# Patient Record
Sex: Female | Born: 1990 | Race: White | Hispanic: No | Marital: Married | Smoking: Never smoker
Health system: Southern US, Community
[De-identification: ages and names within clinical notes are randomized; demographics above are authoritative.]

## PROBLEM LIST (undated history)

## (undated) DIAGNOSIS — I209 Angina pectoris, unspecified: Secondary | ICD-10-CM

---

## 2020-04-04 ENCOUNTER — Telehealth: Payer: BC Managed Care – PPO | Admitting: Nurse Practitioner

## 2020-04-04 ENCOUNTER — Encounter: Payer: Self-pay | Admitting: Nurse Practitioner

## 2020-04-04 DIAGNOSIS — R059 Cough, unspecified: Secondary | ICD-10-CM

## 2020-04-04 MED ORDER — PREDNISONE 20 MG PO TABS: 40.0000 mg | ORAL_TABLET | Freq: Every day | ORAL | 0 refills | Status: AC

## 2020-04-04 MED ORDER — AZITHROMYCIN 250 MG PO TABS: ORAL_TABLET | ORAL | 0 refills | Status: DC

## 2020-04-04 MED ORDER — PROMETHAZINE-DM 6.25-15 MG/5ML PO SYRP: 5.0000 mL | ORAL_SOLUTION | Freq: Four times a day (QID) | ORAL | 0 refills | Status: AC | PRN

## 2020-04-04 NOTE — Progress Notes (Signed)
Terri Bradshaw are scheduled for a virtual visit with your provider today.    Just as we do with appointments in the office, we must obtain your consent to participate.  Your consent will be active for this visit and any virtual visit you may have with one of our providers in the next 365 days.    If you have a MyChart account, I can also send a copy of this consent to you electronically.  All virtual visits are billed to your insurance company just like a traditional visit in the office.  As this is a virtual visit, video technology does not allow for your provider to perform a traditional examination.  This may limit your provider's ability to fully assess your condition.  If your provider identifies any concerns that need to be evaluated in person or the need to arrange testing such as labs, EKG, etc, we will make arrangements to do so.    Although advances in technology are sophisticated, we cannot ensure that it will always work on either your end or our end.  If the connection with a video visit is poor, we may have to switch to a telephone visit.  With either a video or telephone visit, we are not always able to ensure that we have a secure connection.   I need to obtain your verbal consent now.   Are you willing to proceed with your visit today?   Rodneisha Bonnet has provided verbal consent on 04/04/2020 for a virtual visit (video or telephone).   Mary-Margaret Daphine Deutscher, FNP 04/04/2020  1:34 PM   Virtual Visit via video Note   Due to COVID-19 pandemic this visit was conducted virtually. This visit type was conducted due to national recommendations for restrictions regarding the COVID-19 Pandemic (e.g. social distancing, sheltering in place) in an effort to limit this patient's exposure and mitigate transmission in our community. All issues noted in this document were discussed and addressed.  A physical exam was not performed with this format.  I connected with  Terri Bradshaw  on  04/04/20 at 1:32 by telephone, video was not available on patients end. and verified that I am speaking with the correct person using two identifiers. Terri Bradshaw is currently located at home and no one is currently with her during visit. The provider, Mary-Margaret Daphine Deutscher, FNP is located in their office at time of visit.  I discussed the limitations, risks, security and privacy concerns of performing an evaluation and management service by video  and the availability of in person appointments. I also discussed with the patient that there may be a patient responsible charge related to this service. The patient expressed understanding and agreed to proceed.   History and Present Illness:   Chief Complaint: Cough   HPI Patients states that cough started 2 weeks ago. [facility]essalon perles which have not helped. The cough feels like something is going to come up but does not . She says during the day cough is dry.she has tried no other OTC meds.   Review of Systems  Constitutional: Positive for malaise/fatigue. Negative for chills and fever.  HENT: Positive for congestion. Negative for sinus pain and sore throat.   Respiratory: Positive for cough. Negative for sputum production and shortness of breath.   Musculoskeletal: Negative for myalgias.  Neurological: Negative for dizziness and headaches.       Observations/Objective: Alert and oriented- answers all questions appropriately No distress  dry cough during visit no SOB noted   Assessment and Plan: Terri Bradshaw in today with chief complaint of Cough   1. Cough 1. Take meds as prescribed 2. Use a cool mist humidifier especially during the winter months and when heat has been humid. 3. Use saline nose sprays frequently 4. Saline irrigations of the nose can be very helpful if done frequently.  * 4X daily for 1 week*  * Use of a nettie pot can be helpful with this. Follow  directions with this* 5. Drink plenty of fluids 6. Keep thermostat turn down low 7.For any cough or congestion  Cough meds as prescribed 8. For fever or aces or pains- take tylenol or ibuprofen appropriate for age and weight.  * for fevers greater than 101 orally you may alternate ibuprofen and tylenol every  3 hours.   Meds ordered this encounter  Medications  . predniSONE (DELTASONE) 20 MG tablet    Sig: Take 2 tablets (40 mg total) by mouth daily with breakfast for 5 days. 2 po daily for 5 days    Dispense:  10 tablet    Refill:  0    Order Specific Question:   Supervising Provider    Answer:   Hyacinth Meeker, BRIAN [[id number]]  . promethazine-dextromethorphan (PROMETHAZINE-DM) 6.25-15 MG/5ML syrup    Sig: Take 5 mLs by mouth 4 (four) times daily as needed for cough.    Dispense:  118 mL    Refill:  0    Order Specific Question:   Supervising Provider    Answer:   MILLER, BRIAN [[id number]]  . azithromycin (ZITHROMAX Z-PAK) 250 MG tablet    Sig: As directed    Dispense:  6 tablet    Refill:  0    Order Specific Question:   Supervising Provider    Answer:   Eber Hong [[id number]]        Follow Up Instructions: prn    I discussed the assessment and treatment plan with the patient. The patient was provided an opportunity to ask questions and all were answered. The patient agreed with the plan and demonstrated an understanding of the instructions.   The patient was advised to call back or seek an in-person evaluation if the symptoms worsen or if the condition fails to improve as anticipated.  The above assessment and management plan was discussed with the patient. The patient verbalized understanding of and has agreed to the management plan. Patient is aware to call the clinic if symptoms persist or worsen. Patient is aware when to return to the clinic for a follow-up visit. Patient educated on when it is appropriate to go to the emergency department.   Time call ended:1:45 I provided 13  minutes of face-to-face time during this encounter.    Mary-Margaret Daphine Deutscher, FNP

## 2020-10-12 ENCOUNTER — Other Ambulatory Visit: Payer: Self-pay

## 2020-10-12 ENCOUNTER — Emergency Department: Admission: EM | Admit: 2020-10-12 | Discharge: 2020-10-12 | Discharge status: Absent without leave | Payer: Self-pay

## 2020-10-12 ENCOUNTER — Emergency Department
Admission: EM | Admit: 2020-10-12 | Discharge: 2020-10-12 | Disposition: A | Discharge status: Home / Self Care | Payer: BC Managed Care – PPO | Attending: Emergency Medicine | Admitting: Emergency Medicine

## 2020-10-12 ENCOUNTER — Emergency Department: Payer: BC Managed Care – PPO

## 2020-10-12 ENCOUNTER — Encounter: Payer: Self-pay | Admitting: Radiology

## 2020-10-12 DIAGNOSIS — R002 Palpitations: Secondary | ICD-10-CM | POA: Diagnosis not present

## 2020-10-12 DIAGNOSIS — R079 Chest pain, unspecified: Secondary | ICD-10-CM

## 2020-10-12 DIAGNOSIS — R072 Precordial pain: Secondary | ICD-10-CM | POA: Insufficient documentation

## 2020-10-12 DIAGNOSIS — I498 Other specified cardiac arrhythmias: Secondary | ICD-10-CM

## 2020-10-12 LAB — BASIC METABOLIC PANEL
Anion gap: 6 (ref 5–15)
BUN: 6 mg/dL (ref 6–20)
CO2: 25 mmol/L (ref 22–32)
Calcium: 9 mg/dL (ref 8.9–10.3)
Chloride: 107 mmol/L (ref 98–111)
Creatinine, Ser: 0.82 mg/dL (ref 0.44–1.00)
GFR, Estimated: >60 mL/min (ref >60)
Glucose, Bld: 118 mg/dL — ABNORMAL HIGH (ref 70–99)
Potassium: 4.1 mmol/L (ref 3.5–5.1)
Sodium: 138 mmol/L (ref 135–145)

## 2020-10-12 LAB — CBC
HCT: 42.5 % (ref 36.0–46.0)
Hemoglobin: 14.2 g/dL (ref 12.0–15.0)
MCH: 28.9 pg (ref 26.0–34.0)
MCHC: 33.4 g/dL (ref 30.0–36.0)
MCV: 86.4 fL (ref 80.0–100.0)
Platelets: 353 10*3/uL (ref 150–400)
RBC: 4.92 MIL/uL (ref 3.87–5.11)
RDW: 12.5 % (ref 11.5–15.5)
WBC: 11.5 10*3/uL — ABNORMAL HIGH (ref 4.0–10.5)
nRBC: 0 % (ref 0.0–0.2)

## 2020-10-12 LAB — TROPONIN I (HIGH SENSITIVITY)
Troponin I (High Sensitivity): 4 ng/L (ref <18)
Troponin I (High Sensitivity): 3 ng/L (ref <18)

## 2020-10-12 LAB — POC URINE PREG, ED: Preg Test, Ur: NEGATIVE

## 2020-10-12 LAB — MAGNESIUM: Magnesium: 1.8 mg/dL (ref 1.7–2.4)

## 2020-10-12 LAB — TSH: TSH: 3.459 u[IU]/mL (ref 0.350–4.500)

## 2020-10-12 MED ORDER — PANTOPRAZOLE SODIUM 40 MG PO TBEC: 40.0000 mg | DELAYED_RELEASE_TABLET | Freq: Every day | ORAL | 0 refills | Status: DC

## 2020-10-12 NOTE — ED Triage Notes (Signed)
Patient reports the feeling of her heart fluttering that began a few hours ago. The fluttering is intermittent. Endorses a burning sensation in (L) chest but denies chest pain. Has went to PCP for this in past and was prescribed an anxiety medication.

## 2020-10-12 NOTE — ED Provider Notes (Signed)
[facility] Emergency Department Provider Note  ____________________________________________   Event Date/Time   First MD Initiated Contact with Patient 10/12/20 0730     (approximate)  I have reviewed the triage vital signs and the nursing notes.   HISTORY  Chief Complaint No chief complaint on file.   HPI Terri Bradshaw is a 30 y.o. female without significant past medical history who presents for assessment of approximately 1 month of some intermittent palpitations and some substernal chest pain.  Patient states she felt the symptoms last night when she woke up and that she had some burning of her chest that lasted few seconds before resolving but that she felt her heart was fluttering for a while after that.  States she currently feels back to baseline.  States has been having on and off for the last month and most of them happen when she is at rest.  She states that getting up and walking seems to help.  She denies any shortness of breath, cough, headache, earache, sore throat, abdominal pain, vomiting, diarrhea, urinary symptoms, back pain, rash or extremity pain.  No recent falls or injuries.  No significant recent EtOH or NSAID use.  No other acute concerns at this time.         History reviewed. No pertinent past medical history.  There are no problems to display for this patient.   No past surgical history on file.  Prior to Admission medications   Medication Sig Start Date End Date Taking? Authorizing Provider  pantoprazole (PROTONIX) 40 MG tablet Take 1 tablet (40 mg total) by mouth daily. 10/12/20 11/11/20 Yes Gilles Chiquito, MD  azithromycin (ZITHROMAX Z-PAK) 250 MG tablet As directed 04/04/20   Bennie Pierini, FNP  promethazine-dextromethorphan (PROMETHAZINE-DM) 6.25-15 MG/5ML syrup Take 5 mLs by mouth 4 (four) times daily as needed for cough. 04/04/20   Bennie Pierini, FNP    Allergies Bactrim  [sulfamethoxazole-trimethoprim]  No family history on file.  Social History    Review of Systems  Review of Systems  Constitutional:  Negative for chills and fever.  HENT:  Negative for sore throat.   Eyes:  Negative for pain.  Respiratory:  Negative for cough and stridor.   Cardiovascular:  Positive for chest pain and palpitations.  Gastrointestinal:  Negative for vomiting.  Genitourinary:  Negative for dysuria.  Musculoskeletal:  Negative for myalgias.  Skin:  Negative for rash.  Neurological:  Negative for seizures, loss of consciousness and headaches.  Psychiatric/Behavioral:  Negative for suicidal ideas. The patient is nervous/anxious.   All other systems reviewed and are negative.    ____________________________________________   PHYSICAL EXAM:  VITAL SIGNS: ED Triage Vitals  Enc Vitals Group     BP 10/12/20 0351 (!) 134/95     Pulse Rate 10/12/20 0351 97     Resp 10/12/20 0556 16     Temp 10/12/20 0351 99 F (37.2 C)     Temp Source 10/12/20 0351 Oral     SpO2 10/12/20 0351 99 %     Weight 10/12/20 0354 280 lb (127 kg)     Height 10/12/20 0354 5\' 6"  (1.676 m)     Head Circumference --      Peak Flow --      Pain Score --      Pain Loc --      Pain Edu? --      Excl. in GC? --    Vitals:   10/12/20 12/12/20 10/12/20 12/12/20  BP: 117/84 126/82  Pulse: 91 81  Resp: 16 18  Temp:    SpO2: 100% 98%   Physical Exam Vitals and nursing note reviewed.  Constitutional:      General: She is not in acute distress.    Appearance: She is well-developed. She is obese.  HENT:     Head: Normocephalic and atraumatic.     Right Ear: External ear normal.     Left Ear: External ear normal.     Nose: Nose normal.  Eyes:     Conjunctiva/sclera: Conjunctivae normal.  Cardiovascular:     Rate and Rhythm: Normal rate and regular rhythm.     Heart sounds: No murmur heard. Pulmonary:     Effort: Pulmonary effort is normal. No respiratory distress.     Breath sounds: Normal  breath sounds.  Abdominal:     Palpations: Abdomen is soft.     Tenderness: There is no abdominal tenderness.  Musculoskeletal:     Cervical back: Neck supple.  Skin:    General: Skin is warm and dry.     Capillary Refill: Capillary refill takes less than 2 seconds.  Neurological:     Mental Status: She is alert and oriented to person, place, and time.  Psychiatric:        Mood and Affect: Mood normal.     ____________________________________________   LABS (all labs ordered are listed, but only abnormal results are displayed)  Labs Reviewed  BASIC METABOLIC PANEL - Abnormal; Notable for the following components:      Result Value   Glucose, Bld 118 (*)    All other components within normal limits  CBC - Abnormal; Notable for the following components:   WBC 11.5 (*)    All other components within normal limits  MAGNESIUM  TSH  POC URINE PREG, ED  TROPONIN I (HIGH SENSITIVITY)  TROPONIN I (HIGH SENSITIVITY)  TROPONIN I (HIGH SENSITIVITY)   ____________________________________________  EKG  Sinus rhythm with a ventricular rate of 100, normal axis, unremarkable intervals, nonspecific ST change in lead III without other clear evidence of acute ischemia or significant arrhythmia. ____________________________________________  RADIOLOGY  ED MD interpretation: Chest x-ray remarkable for no focal consolidation, effusion, edema, pneumothorax or other clear acute intrathoracic process.  Official radiology report(s): DG Chest 2 View  Result Date: 10/12/2020 CLINICAL DATA:  Heart fluttering.  Burning sensation in chest. EXAM: CHEST - 2 VIEW COMPARISON:  None. FINDINGS: The heart size and mediastinal contours are within normal limits. Both lungs are clear. The visualized skeletal structures are unremarkable. IMPRESSION: No active cardiopulmonary disease. Electronically Signed   By: Gerome Sam III M.D.   On: 10/12/2020 05:03     ____________________________________________   PROCEDURES  Procedure(s) performed (including Critical Care):  .1-3 Lead EKG Interpretation Performed by: Gilles Chiquito, MD Authorized by: Gilles Chiquito, MD     Interpretation: normal     ECG rate assessment: normal     Rhythm: sinus rhythm     Ectopy: none     Conduction: normal     ____________________________________________   INITIAL IMPRESSION / ASSESSMENT AND PLAN / ED COURSE      Patient presents for assessment of an episode of fluttering in her chest associate with some burning lasted a few seconds similar to episodes abdominal off over the last month.  Patient notes she was seen by her psychiatrist was concerned about possible anxiety.  She states she wanted to get checked out because she recently quit in  the past.  From a heart attack.  On arrival she is afebrile hemodynamically stable.  She states she is currently chest pain-free.  Differential includes ACS, PE, arrhythmia, anemia, pneumonia, GERD, OSA, pneumothorax, and hyperthyroidism  Overall description of symptoms is improved with exertion and not associated with any shortness of breath with chest burning only lasting a few seconds and patient without evidence of hypoxia tachypnea heart rate of 91 have a very low suspicion for PE at this time.  Arrival history is not consistent with dissection.  ECG shows no evidence of arrhythmia and given nonelevated troponin x2 I have low suspicion for ACS or myocarditis.  Chest x-ray remarkable for no focal consolidation, effusion, edema, pneumothorax or other clear acute intrathoracic process.  BMP shows no significant electrolyte or metabolic derangements.  TSH is WNL.  Magnesium is unremarkable.  Differential still includes reflux versus OSA versus other unclear etiologies at this time although given stable vitals with otherwise reassuring exam work-up and duration of symptoms I have a low suspicion for immediate  life-threatening process and I believe patient is stable for discharge and close outpatient follow-up.  We will trial patient on a course of Protonix to see if this helps.  Discharged stable condition.  Strict return precautions advised and discussed.      ____________________________________________   FINAL CLINICAL IMPRESSION(S) / ED DIAGNOSES  Final diagnoses:  Palpitations  Chest pain, unspecified type    Medications - No data to display   ED Discharge Orders          Ordered    pantoprazole (PROTONIX) 40 MG tablet  Daily        10/12/20 0908             Note:  This document was prepared using Dragon voice recognition software and may include unintentional dictation errors.    Gilles Chiquito, MD 10/12/20 [phone]

## 2020-12-01 ENCOUNTER — Telehealth: Payer: Self-pay

## 2020-12-01 NOTE — Telephone Encounter (Signed)
[facility] referring for pregnancy. LMP 07/31/20. Paper records in Epic. Called and left voicemail for patient to call back to be scheduled.

## 2020-12-23 ENCOUNTER — Encounter: Payer: BC Managed Care – PPO | Admitting: Obstetrics

## 2021-02-04 LAB — OB RESULTS CONSOLE HIV ANTIBODY (ROUTINE TESTING): HIV: NONREACTIVE

## 2021-09-21 LAB — OB RESULTS CONSOLE HEPATITIS B SURFACE ANTIGEN: Hepatitis B Surface Ag: NEGATIVE

## 2021-09-21 LAB — HEPATITIS C ANTIBODY: HCV Ab: NEGATIVE

## 2021-09-21 LAB — OB RESULTS CONSOLE VARICELLA ZOSTER ANTIBODY, IGG: Varicella: NON-IMMUNE/NOT IMMUNE

## 2021-09-21 LAB — OB RESULTS CONSOLE RUBELLA ANTIBODY, IGM: Rubella: NON-IMMUNE/NOT IMMUNE

## 2021-09-21 LAB — OB RESULTS CONSOLE HIV ANTIBODY (ROUTINE TESTING): HIV: NONREACTIVE

## 2021-12-08 LAB — OB RESULTS CONSOLE ABO/RH: RH Type: POSITIVE

## 2021-12-23 ENCOUNTER — Encounter
Admission: RE | Admit: 2021-12-23 | Discharge: 2021-12-23 | Disposition: A | Discharge status: Home / Self Care | Payer: 59 | Source: Ambulatory Visit | Attending: Anesthesiology | Admitting: Anesthesiology

## 2021-12-23 NOTE — Consult Note (Signed)
[facility] Anesthesia Consultation  Terri Bradshaw WCB:[id number] DOB: [birth year] DOA: 12/23/2021 PCP: Miki Kins, FNP   Requesting physician: Jennell Corner Date of consultation: 12/23/2021 Reason for consultation: Obesity during pregnancy  CHIEF COMPLAINT:  Obesity during pregnancy  HISTORY OF PRESENT ILLNESS: Terri Bradshaw  is a 31 y.o. female with no PMH   PAST MEDICAL HISTORY:  No past medical history on file.  PAST SURGICAL HISTORY: No past surgical history on file.  SOCIAL HISTORY:  Social History   Tobacco Use   Smoking status: Not on file   Smokeless tobacco: Not on file  Substance Use Topics   Alcohol use: Not on file    FAMILY HISTORY: No family history on file.  DRUG ALLERGIES:  Allergies  Allergen Reactions   Bactrim [Sulfamethoxazole-Trimethoprim]     REVIEW OF SYSTEMS:   RESPIRATORY: No cough, shortness of breath, wheezing.  CARDIOVASCULAR: No chest pain, orthopnea, edema.  HEMATOLOGY: No anemia, easy bruising or bleeding SKIN: No rash or lesion. NEUROLOGIC: No tingling, numbness, weakness.  PSYCHIATRY: No anxiety or depression.   MEDICATIONS AT HOME:  Prior to Admission medications   Medication Sig Start Date End Date Taking? Authorizing Provider  azithromycin (ZITHROMAX Z-PAK) 250 MG tablet As directed 04/04/20   Daphine Deutscher, Mary-Margaret, FNP  pantoprazole (PROTONIX) 40 MG tablet Take 1 tablet (40 mg total) by mouth daily. 10/12/20 11/11/20  Gilles Chiquito, MD  promethazine-dextromethorphan (PROMETHAZINE-DM) 6.25-15 MG/5ML syrup Take 5 mLs by mouth 4 (four) times daily as needed for cough. 04/04/20   Daphine Deutscher, Mary-Margaret, FNP      PHYSICAL EXAMINATION:   VITAL SIGNS: Height 5\' 6"  (1.676 m), weight (!) 136.5 kg.  GENERAL:  31 y.o.-year-old patient no acute distress.  HEENT: Head atraumatic, normocephalic. Oropharynx and nasopharynx clear. MP 2, TM distance >3 cm, normal mouth  opening. LUNGS: No use of accessory muscles of respiration.   EXTREMITIES: No pedal edema, cyanosis, or clubbing.  NEUROLOGIC: normal gait PSYCHIATRIC: The patient is alert and oriented x 3.  SKIN: No obvious rash, lesion, or ulcer.    IMPRESSION AND PLAN:   Terri Bradshaw  is a 31 y.o. female presenting with obesity during pregnancy. BMI is currently 48.5 at [redacted] weeks gestation.   We discussed analgesic options during labor including epidural analgesia. Discussed that in obesity there can be increased difficulty with epidural placement or even failure of successful epidural. We also discussed that even after successful epidural placement there is increased risk of catheter migration out of the epidural space that would require catheter replacement. Discussed use of epidural vs spinal vs GA if cesarean delivery is required. Discussed increased risk of difficult intubation during pregnancy should an emergency cesarean delivery be required.   This patient is appropriate for anesthetic care at [facility] in [place].  We discussed the limitations of a community hospital including but not limited to staffing, imaging, medications, and blood products.  The patient is aware of these limitations.  All questions answered and concerns addressed.    [place], MD  Anesthesiology

## 2022-02-04 LAB — OB RESULTS CONSOLE HIV ANTIBODY (ROUTINE TESTING): HIV: NONREACTIVE

## 2022-02-04 LAB — OB RESULTS CONSOLE GBS: GBS: POSITIVE

## 2022-02-04 LAB — OB RESULTS CONSOLE RPR: RPR: NONREACTIVE

## 2022-02-11 ENCOUNTER — Other Ambulatory Visit: Payer: Self-pay | Admitting: Family

## 2022-02-15 ENCOUNTER — Telehealth: Payer: Self-pay

## 2022-02-15 ENCOUNTER — Other Ambulatory Visit: Payer: Self-pay | Admitting: Family

## 2022-02-15 MED ORDER — DESVENLAFAXINE SUCCINATE ER 25 MG PO TB24: 25.0000 mg | ORAL_TABLET | Freq: Every day | ORAL | 3 refills | Status: DC

## 2022-02-15 MED ORDER — DESVENLAFAXINE SUCCINATE ER 50 MG PO TB24: 50.0000 mg | ORAL_TABLET | Freq: Every day | ORAL | 5 refills | Status: DC

## 2022-02-15 NOTE — Telephone Encounter (Signed)
Patient Terri Bradshaw stating she called 2 weeks ago and didn't receive a call back about her anxiety and that her midwife asked her to call here.... I did see that you called her and Terri Bradshaw but she claims that she didn't get it.

## 2022-03-08 ENCOUNTER — Inpatient Hospital Stay: Payer: 59 | Admitting: General Practice

## 2022-03-08 ENCOUNTER — Other Ambulatory Visit: Payer: Self-pay

## 2022-03-08 ENCOUNTER — Inpatient Hospital Stay
Admission: EM | Admit: 2022-03-08 | Discharge: 2022-03-11 | DRG: 787 | Disposition: A | Discharge status: Home / Self Care | Payer: 59

## 2022-03-08 DIAGNOSIS — O323XX0 Maternal care for face, brow and chin presentation, not applicable or unspecified: Secondary | ICD-10-CM | POA: Diagnosis present

## 2022-03-08 DIAGNOSIS — Z3A40 40 weeks gestation of pregnancy: Secondary | ICD-10-CM

## 2022-03-08 DIAGNOSIS — O99213 Obesity complicating pregnancy, third trimester: Secondary | ICD-10-CM

## 2022-03-08 DIAGNOSIS — F419 Anxiety disorder, unspecified: Secondary | ICD-10-CM | POA: Diagnosis present

## 2022-03-08 DIAGNOSIS — O99214 Obesity complicating childbirth: Secondary | ICD-10-CM | POA: Diagnosis present

## 2022-03-08 DIAGNOSIS — O0993 Supervision of high risk pregnancy, unspecified, third trimester: Principal | ICD-10-CM

## 2022-03-08 DIAGNOSIS — O479 False labor, unspecified: Secondary | ICD-10-CM | POA: Diagnosis present

## 2022-03-08 DIAGNOSIS — O0990 Supervision of high risk pregnancy, unspecified, unspecified trimester: Secondary | ICD-10-CM

## 2022-03-08 DIAGNOSIS — O9902 Anemia complicating childbirth: Secondary | ICD-10-CM | POA: Diagnosis present

## 2022-03-08 DIAGNOSIS — O99210 Obesity complicating pregnancy, unspecified trimester: Secondary | ICD-10-CM | POA: Insufficient documentation

## 2022-03-08 DIAGNOSIS — O480 Post-term pregnancy: Principal | ICD-10-CM | POA: Diagnosis present

## 2022-03-08 DIAGNOSIS — O99212 Obesity complicating pregnancy, second trimester: Secondary | ICD-10-CM | POA: Insufficient documentation

## 2022-03-08 DIAGNOSIS — O99824 Streptococcus B carrier state complicating childbirth: Secondary | ICD-10-CM | POA: Diagnosis present

## 2022-03-08 DIAGNOSIS — D62 Acute posthemorrhagic anemia: Secondary | ICD-10-CM | POA: Diagnosis not present

## 2022-03-08 HISTORY — DX: Angina pectoris, unspecified: I20.9

## 2022-03-08 LAB — COMPREHENSIVE METABOLIC PANEL
ALT: 17 U/L (ref 0–44)
AST: 21 U/L (ref 15–41)
Albumin: 3 g/dL — ABNORMAL LOW (ref 3.5–5.0)
Alkaline Phosphatase: 144 U/L — ABNORMAL HIGH (ref 38–126)
Anion gap: 11 (ref 5–15)
BUN: 8 mg/dL (ref 6–20)
CO2: 21 mmol/L — ABNORMAL LOW (ref 22–32)
Calcium: 9.9 mg/dL (ref 8.9–10.3)
Chloride: 100 mmol/L (ref 98–111)
Creatinine, Ser: 0.78 mg/dL (ref 0.44–1.00)
GFR, Estimated: >60 mL/min (ref >60)
Glucose, Bld: 87 mg/dL (ref 70–99)
Potassium: 4.3 mmol/L (ref 3.5–5.1)
Sodium: 132 mmol/L — ABNORMAL LOW (ref 135–145)
Total Bilirubin: 0.7 mg/dL (ref 0.3–1.2)
Total Protein: 6.6 g/dL (ref 6.5–8.1)

## 2022-03-08 LAB — CBC
HCT: 35.1 % — ABNORMAL LOW (ref 36.0–46.0)
Hemoglobin: 12 g/dL (ref 12.0–15.0)
MCH: 30.1 pg (ref 26.0–34.0)
MCHC: 34.2 g/dL (ref 30.0–36.0)
MCV: 88 fL (ref 80.0–100.0)
Platelets: 278 10*3/uL (ref 150–400)
RBC: 3.99 MIL/uL (ref 3.87–5.11)
RDW: 13.5 % (ref 11.5–15.5)
WBC: 17.3 10*3/uL — ABNORMAL HIGH (ref 4.0–10.5)
nRBC: 0 % (ref 0.0–0.2)

## 2022-03-08 LAB — PROTEIN / CREATININE RATIO, URINE
Creatinine, Urine: 116 mg/dL
Protein Creatinine Ratio: 0.09 mg/mg{Cre} (ref 0.00–0.15)
Total Protein, Urine: 11 mg/dL

## 2022-03-08 LAB — TYPE AND SCREEN
ABO/RH(D): B POS
Antibody Screen: NEGATIVE

## 2022-03-08 LAB — ABO/RH: ABO/RH(D): B POS

## 2022-03-08 LAB — RPR: RPR Ser Ql: NONREACTIVE

## 2022-03-08 MED ORDER — AMMONIA AROMATIC IN INHA
RESPIRATORY_TRACT | Status: AC
  Filled 2022-03-08: qty 10

## 2022-03-08 MED ORDER — LIDOCAINE HCL (PF) 1 % IJ SOLN
INTRAMUSCULAR | Status: DC | PRN
  Administered 2022-03-08: 3 mL

## 2022-03-08 MED ORDER — OXYTOCIN-SODIUM CHLORIDE 30-0.9 UT/500ML-% IV SOLN
2.5000 [IU]/h | INTRAVENOUS | Status: DC
  Administered 2022-03-09: 30 [IU] via INTRAVENOUS
  Filled 2022-03-08 (×3): qty 500

## 2022-03-08 MED ORDER — FENTANYL CITRATE (PF) 100 MCG/2ML IJ SOLN: 50.0000 ug | INTRAMUSCULAR | Status: DC | PRN

## 2022-03-08 MED ORDER — FENTANYL-BUPIVACAINE-NACL 0.5-0.125-0.9 MG/250ML-% EP SOLN
EPIDURAL | Status: AC
  Filled 2022-03-08: qty 250

## 2022-03-08 MED ORDER — LIDOCAINE HCL (PF) 1 % IJ SOLN: 30.0000 mL | INTRAMUSCULAR | Status: DC | PRN

## 2022-03-08 MED ORDER — FENTANYL-BUPIVACAINE-NACL 0.5-0.125-0.9 MG/250ML-% EP SOLN
12.0000 mL/h | EPIDURAL | Status: DC | PRN
  Administered 2022-03-08: 12 mL/h via EPIDURAL

## 2022-03-08 MED ORDER — OXYTOCIN-SODIUM CHLORIDE 30-0.9 UT/500ML-% IV SOLN
1.0000 m[IU]/min | INTRAVENOUS | Status: DC
  Administered 2022-03-08: 2 m[IU]/min via INTRAVENOUS

## 2022-03-08 MED ORDER — TERBUTALINE SULFATE 1 MG/ML IJ SOLN: 0.2500 mg | Freq: Once | INTRAMUSCULAR | Status: DC | PRN

## 2022-03-08 MED ORDER — OXYTOCIN 10 UNIT/ML IJ SOLN
INTRAMUSCULAR | Status: AC
  Filled 2022-03-08: qty 2

## 2022-03-08 MED ORDER — DIPHENHYDRAMINE HCL 50 MG/ML IJ SOLN
25.0000 mg | Freq: Once | INTRAMUSCULAR | Status: AC
  Administered 2022-03-08: 25 mg via INTRAVENOUS

## 2022-03-08 MED ORDER — ACETAMINOPHEN 325 MG PO TABS: 650.0000 mg | ORAL_TABLET | ORAL | Status: DC | PRN

## 2022-03-08 MED ORDER — CALCIUM CARBONATE ANTACID 500 MG PO CHEW: 2.0000 | CHEWABLE_TABLET | Freq: Three times a day (TID) | ORAL | Status: DC

## 2022-03-08 MED ORDER — LIDOCAINE-EPINEPHRINE (PF) 1.5 %-1:200000 IJ SOLN
INTRAMUSCULAR | Status: DC | PRN
  Administered 2022-03-08: 3 mL via PERINEURAL

## 2022-03-08 MED ORDER — LACTATED RINGERS IV SOLN: 500.0000 mL | Freq: Once | INTRAVENOUS | Status: DC

## 2022-03-08 MED ORDER — PHENYLEPHRINE 80 MCG/ML (10ML) SYRINGE FOR IV PUSH (FOR BLOOD PRESSURE SUPPORT): 80.0000 ug | PREFILLED_SYRINGE | INTRAVENOUS | Status: DC | PRN

## 2022-03-08 MED ORDER — OXYTOCIN BOLUS FROM INFUSION: 333.0000 mL | Freq: Once | INTRAVENOUS | Status: DC

## 2022-03-08 MED ORDER — EPHEDRINE 5 MG/ML INJ: 10.0000 mg | INTRAVENOUS | Status: DC | PRN

## 2022-03-08 MED ORDER — LIDOCAINE HCL (PF) 1 % IJ SOLN
INTRAMUSCULAR | Status: AC
  Filled 2022-03-08: qty 30

## 2022-03-08 MED ORDER — LACTATED RINGERS IV SOLN: 500.0000 mL | INTRAVENOUS | Status: DC | PRN

## 2022-03-08 MED ORDER — LACTATED RINGERS IV SOLN: INTRAVENOUS | Status: DC

## 2022-03-08 MED ORDER — DIPHENHYDRAMINE HCL 50 MG/ML IJ SOLN
12.5000 mg | INTRAMUSCULAR | Status: DC | PRN
  Filled 2022-03-08: qty 1

## 2022-03-08 MED ORDER — PENICILLIN G POT IN DEXTROSE 60000 UNIT/ML IV SOLN
3.0000 10*6.[IU] | INTRAVENOUS | Status: DC
  Administered 2022-03-08 (×3): 3 10*6.[IU] via INTRAVENOUS
  Filled 2022-03-08 (×3): qty 50

## 2022-03-08 MED ORDER — SODIUM CHLORIDE 0.9 % IV SOLN
5.0000 10*6.[IU] | Freq: Once | INTRAVENOUS | Status: DC
  Filled 2022-03-08: qty 5

## 2022-03-08 MED ORDER — ONDANSETRON HCL 4 MG/2ML IJ SOLN: 4.0000 mg | Freq: Four times a day (QID) | INTRAMUSCULAR | Status: DC | PRN

## 2022-03-08 MED ORDER — SOD CITRATE-CITRIC ACID 500-334 MG/5ML PO SOLN: 30.0000 mL | ORAL | Status: DC | PRN

## 2022-03-08 MED ORDER — CALCIUM CARBONATE ANTACID 500 MG PO CHEW
CHEWABLE_TABLET | ORAL | Status: AC
  Administered 2022-03-08: 400 mg
  Filled 2022-03-08: qty 2

## 2022-03-08 MED ORDER — MISOPROSTOL 200 MCG PO TABS
ORAL_TABLET | ORAL | Status: AC
  Filled 2022-03-08: qty 4

## 2022-03-08 MED ORDER — BUPIVACAINE HCL (PF) 0.25 % IJ SOLN
INTRAMUSCULAR | Status: DC | PRN
  Administered 2022-03-08 (×2): 4 mL via EPIDURAL

## 2022-03-08 NOTE — Progress Notes (Signed)
Labor Progress Note  Terri Bradshaw is a 32 y.o. G1P0 at 87w4dby LMP admitted for active labor  Subjective: Terri Bradshaw reports contractions are stronger and Terri Bradshaw is feeling them all in her back  Objective: BP (!) 125/90   Pulse 97  Notable VS details: reviewed  Fetal Assessment: FHT:  FHR: 130 bpm, variability: moderate,  accelerations:  Abscent,  decelerations:  Absent Category/reactivity:  Category II UC:   regular, every 5 minutes SVE:    Dilation: 6cm  Effacement: 100%  Station:  0  Consistency: soft  Position: posterior  Membrane status:AROM @ 1221 Amniotic color: light meconium  Labs: Lab Results  Component Value Date   WBC 17.3 (H) 03/08/2022   HGB 12.0 03/08/2022   HCT 35.1 (L) 03/08/2022   MCV 88.0 03/08/2022   PLT 278 03/08/2022    Assessment / Plan: 32year old G1P0 at 429w4dith uterine contractions and active labor  Labor:  Terri Bradshaw progressed from 1/80/-2 to 5/100/0 in four hours. Terri Bradshaw now is 6/100/0. Discussed the slow change from 5-6cm and recommended augmentation of either AROM and/or pitocin augmentation. Reviewed benefits and risks of AROM and after thinking about her options Terri Bradshaw consents to AROM. AROM with light meconium. Preeclampsia:  labs stable Fetal Wellbeing:  Category II Pain Control:  Labor support without medications I/D:   GBS positive, treated with PCN at  0827 and 1230, adequately treated Anticipated MOD:  NSVD  DaGertie FeyCNM 03/08/2022, 12:25 PM

## 2022-03-08 NOTE — Anesthesia Procedure Notes (Addendum)
Epidural Patient location during procedure: OB Start time: 03/08/2022 5:32 PM End time: 03/08/2022 5:44 PM  Staffing Anesthesiologist: Dimas Millin, MD Resident/CRNA: Jerrye Noble, CRNA Performed: resident/CRNA   Preanesthetic Checklist Completed: patient identified, IV checked, site marked, risks and benefits discussed, surgical consent, monitors and equipment checked, pre-op evaluation and timeout performed  Epidural Patient position: sitting Prep: ChloraPrep Patient monitoring: heart rate, continuous pulse ox and blood pressure Approach: midline Location: L3-L4 Injection technique: LOR saline  Needle:  Needle type: Tuohy  Needle gauge: 17 G Needle length: 9 cm and 9 Needle insertion depth: 8 cm Catheter type: closed end flexible Catheter size: 19 Gauge Catheter at skin depth: 12 cm Test dose: negative and 1.5% lidocaine with Epi 1:200 K  Assessment Sensory level: T10 Events: blood not aspirated, no cerebrospinal fluid, injection not painful, no injection resistance, no paresthesia and negative IV test  Additional Notes 1 attempt Pt. Evaluated and documentation done after procedure finished. Patient identified. Risks/Benefits/Options discussed with patient including but not limited to bleeding, infection, nerve damage, paralysis, failed block, incomplete pain control, headache, blood pressure changes, nausea, vomiting, reactions to medication both or allergic, itching and postpartum back pain. Confirmed with bedside nurse the patient's most recent platelet count. Confirmed with patient that they are not currently taking any anticoagulation, have any bleeding history or any family history of bleeding disorders. Patient expressed understanding and wished to proceed. All questions were answered. Sterile technique was used throughout the entire procedure. Please see nursing notes for vital signs. Test dose was given through epidural catheter and negative prior to continuing  to dose epidural or start infusion. Warning signs of high block given to the patient including shortness of breath, tingling/numbness in hands, complete motor block, or any concerning symptoms with instructions to call for help. Patient was given instructions on fall risk and not to get out of bed. All questions and concerns addressed with instructions to call with any issues or inadequate analgesia.    Patient tolerated the insertion well without immediate complications.Reason for block:procedure for pain

## 2022-03-08 NOTE — Progress Notes (Addendum)
Labor Progress Note  Terri Bradshaw is a 32 y.o. G1P0 at 60w4dby LMP admitted for active labor  Subjective: she reports the contractions are stronger and more uncomfortable. Desires cervical exam. If not close to delivery, would like an epidural.  Objective: BP (!) 145/93   Pulse 90   Temp 98.4 F (36.9 C) (Oral)   Resp 18  Notable VS details: reviewed  Fetal Assessment: FHT:  intermittent auscultation, FHR 135bpm, moderate variability UC:   she reports they are every 2-3 min, palpate moderate to strong SVE:    Dilation: 7cm  Effacement: 100%  Station:  +1  Consistency: soft  Position: posterior  Membrane status:AROM @ 1221 Amniotic color: light meconium  Labs: Lab Results  Component Value Date   WBC 17.3 (H) 03/08/2022   HGB 12.0 03/08/2022   HCT 35.1 (L) 03/08/2022   MCV 88.0 03/08/2022   PLT 278 03/08/2022   Assessment / Plan: 32year old G1P0 at 467w4dith uterine contractions and active labor  Labor:  She progressed from 1/80/-2 to 5/100/0 in four hours. She then progressed to 6/100/0 in four hours. AROM with light meconium. She has now progressed to 7/100/+1 over the last four hours. Discussed augmentation with pitocin and she is agreeable. Pit ordered. Dr. JaGlennon Macotified of labor progress.  Confirmed vertex with bedside [place]reeclampsia:   CMP and P/C ratio ordered Fetal Wellbeing:   intermittent auscultation, no concerns Pain Control:   desires epidural I/D:   GBS positive, treated with PCN at 0827, 1230,  Anticipated MOD:  NSVD  DaGertie FeyCNM 03/08/2022, 4:36 PM

## 2022-03-08 NOTE — Progress Notes (Signed)
Labor Progress Note  Mikal Ferring is a 32 y.o. G1P0 at 50w4dby LMP admitted for active labor  Subjective: she is comfortable after her epidural  Objective: BP 134/78   Pulse 91   Temp 98.5 F (36.9 C) (Oral)   Resp 16  Notable VS details: reviewed  Fetal Assessment: FHT:  FHR: 135 bpm, variability: moderate,  accelerations:  Present,  decelerations:  Present recurrent variable and early decelerations Category/reactivity:  Category II UC:   regular, every 2-4 minutes SVE:    Dilation: 7cm  Effacement: edematous  Station:  0  Consistency: soft  Position: middle  Membrane status:AROM @ 1221 Amniotic color: light meconium  Labs: Lab Results  Component Value Date   WBC 17.3 (H) 03/08/2022   HGB 12.0 03/08/2022   HCT 35.1 (L) 03/08/2022   MCV 88.0 03/08/2022   PLT 278 03/08/2022    Assessment / Plan: 32year old G1P0 at 456w4dith uterine contractions and active labor  Labor:  Currently 7/swollen/0, fetal head position LOA with but military, not flexed. Will do IV Benadryl and reposition with trendelenburg and side lying release to relieve cervical edema and help fetal head flex.  - Dr. JaGlennon Macotified of labor progress and plan of care Preeclampsia:  labs stable Fetal Wellbeing:  Category II Pain Control:  Epidural I/D:   GBS positive, received adequate treatment Anticipated MOD:  NSVD  DaGertie FeyCNM 03/08/2022, 7:52 PM

## 2022-03-08 NOTE — H&P (Signed)
OB History & Physical   History of Present Illness:  Chief Complaint:   HPI:  Terri Bradshaw is a 32 y.o. G1P0 female at 32w4ddated by LMP.  She presents to L&D for uterine contractions since 5pm that have become progressively stronger and closer together.  Active FM onset of ctx @ 1700 currently every 3-4 minutes   Pregnancy Issues: 1. Obesity, BMI 53 2. GBS positive 3. Recurrent asymptomatic bacteruria 4. Anxiety 5. Rubella and varicella non-immune   Maternal Medical History:   Past Medical History:  Diagnosis Date   Anginal pain ([place]     History reviewed. No pertinent surgical history.  Allergies  Allergen Reactions   Bactrim [Sulfamethoxazole-Trimethoprim]     Prior to Admission medications   Medication Sig Start Date End Date Taking? Authorizing Provider  cetirizine (ZYRTEC ALLERGY) 10 MG tablet Take 10 mg by mouth at bedtime.   Yes [provider]  desvenlafaxine (PRISTIQ) 25 MG 24 hr tablet Take 1 tablet (25 mg total) by mouth daily. Take with 50 mg tablet for a total of '75mg'$ . 02/15/22  Yes SMechele Claude FNP  desvenlafaxine (PRISTIQ) 50 MG 24 hr tablet Take 1 tablet (50 mg total) by mouth daily. Take with '25mg'$  tablet for a total of '75mg'$ . 02/15/22  Yes SMechele Claude FNP  Prenatal Vit-Fe Fumarate-FA (PRENATAL VITAMINS) 28-0.8 MG TABS Take 1 tablet by mouth daily. 11/11/21  Yes [provider]  azithromycin (ZITHROMAX Z-PAK) 250 MG tablet As directed Patient not taking: Reported on 03/08/2022 04/04/20   MChevis Pretty FNP  EPINEPHrine 0.3 mg/0.3 mL IJ SOAJ injection Inject 0.3 mg into the muscle as needed for anaphylaxis.    [provider]  pantoprazole (PROTONIX) 40 MG tablet Take 1 tablet (40 mg total) by mouth daily. 10/12/20 11/11/20  SLucrezia Starch MD  promethazine-dextromethorphan (PROMETHAZINE-DM) 6.25-15 MG/5ML syrup Take 5 mLs by mouth 4 (four) times daily as needed for cough. Patient not taking: Reported on  03/08/2022 04/04/20   MChevis Pretty FNP    [facility]: [place]: She  reports that she has never smoked. She has never used smokeless tobacco.  Family History: family history is not on file.   Review of Systems: A full review of systems was performed and negative except as noted in the HPI.    Physical Exam:  Vital Signs: BP (!) 125/90   Pulse 97  General: no acute distress.  HEENT: normocephalic, atraumatic Heart: regular rate & rhythm.  No murmurs/rubs/gallops Lungs: clear to auscultation bilaterally, normal respiratory effort Abdomen: soft, gravid, non-tender;  EFW: 7.5lb Pelvic:   External: Normal external female genitalia  Cervix: Dilation: 5 / Effacement (%): 100 / Station: 0    Extremities: non-tender, symmetric, mild edema bilaterally.  DTRs: +2  Neurologic: Alert & oriented x 3.    No results found for this or any previous visit (from the past 24 hour(s)).  Pertinent Results:  Prenatal Labs: Blood type/Rh B positive  Antibody screen neg  Rubella Non-Immune  Varicella Non-Immune  RPR NR  HBsAg Neg  HIV NR  GC neg  Chlamydia neg  Genetic screening negative  1 hour GTT 103  3 hour GTT   GBS Pos   FHT: 130bpm, moderate variability, accelerations present, no decelerations, TOCO: contractions q340m, palpate moderate SVE:  Dilation: 5 / Effacement (%): 100 / Station: 0    Cephalic by leopolds  No results found.  Assessment:  TaAzarya Bindas a  32 y.o. G1P0 female at 58w4dwith uterine contractions.   Plan:  1. Admit to Labor & Delivery; consents reviewed and obtained - Dr. JGlennon Macnotified of admission and plan of care  2. Fetal Well being  - Fetal Tracing: Category I tracing, can do intermittent fetal monitoring - Group B Streptococcus ppx indicated: GBS positive by urine, treat with PCN in labor - Presentation: vertex confirmed by SVE   3. Routine OB: - Prenatal labs reviewed, as above - Rh  positive - CBC, T&S, RPR on admit - Clear fluids, IVF during antibiotic adnimistration, then saline lock  4. Monitoring of Labor -  Contractions q3-466m, external toco in place -  Plan for continuous fetal monitoring  -  Maternal pain control as desired; requesting unmedicated comfort measures but is open to an epidural if pain becomes too strong - Anticipate vaginal delivery  5. Post Partum Planning: - Infant feeding: breastfeeding - Contraception: NFP  DaGertie FeyCNM 03/08/22 8:05 AM

## 2022-03-08 NOTE — Anesthesia Preprocedure Evaluation (Signed)
Anesthesia Evaluation  Patient identified by MRN, date of birth, ID band Patient awake    Reviewed: Allergy & Precautions, H&P , NPO status , Patient's Chart, lab work & pertinent test results  Airway Mallampati: IV  TM Distance: >3 FB Neck ROM: full    Dental  (+) Chipped, Dental Advidsory Given   Pulmonary neg pulmonary ROS, neg shortness of breath, neg recent URI   Pulmonary exam normal        Cardiovascular Exercise Tolerance: Good + angina  (-) Past MI negative cardio ROS Normal cardiovascular exam(-) Valvular Problems/Murmurs     Neuro/Psych  Headaches  negative psych ROS   GI/Hepatic negative GI ROS, Neg liver ROS,GERD  ,,  Endo/Other    Morbid obesity  Renal/GU negative Renal ROS  negative genitourinary   Musculoskeletal   Abdominal   Peds  Hematology negative hematology ROS (+)   Anesthesia Other Findings Past Medical History: No date: Anginal pain ([place])  History reviewed. No pertinent surgical history.     Reproductive/Obstetrics (+) Pregnancy                             Anesthesia Physical Anesthesia Plan  ASA: 3  Anesthesia Plan: Epidural   Post-op Pain Management:    Induction:   PONV Risk Score and Plan: Ondansetron and Dexamethasone  Airway Management Planned: Natural Airway  Additional Equipment:   Intra-op Plan:   Post-operative Plan:   Informed Consent: I have reviewed the patients History and Physical, chart, labs and discussed the procedure including the risks, benefits and alternatives for the proposed anesthesia with the patient or authorized representative who has indicated his/her understanding and acceptance.     Dental Advisory Given  Plan Discussed with: Anesthesiologist and CRNA  Anesthesia Plan Comments: (Patient reports no bleeding problems and no anticoagulant use.   Patient consented for risks of anesthesia including but not  limited to:  - adverse reactions to medications - risk of bleeding, infection and or nerve damage from epidural that could lead to paralysis - risk of headache or failed epidural - nerve damage due to positioning - that if epidural is used for C-section that there is a chance of epidural failure requiring spinal placement or conversion to GA - Damage to heart, brain, lungs, other parts of body or loss of life  Patient voiced understanding.)       Anesthesia Quick Evaluation

## 2022-03-08 NOTE — Progress Notes (Signed)
Labor Progress Note  Terri Bradshaw is a 33 y.o. G1P0 at 33w4dby LMP admitted for active labor  Subjective: she is comfortable, denies any rectal pressure or discomfort  Objective: BP 134/78   Pulse 91   Temp 98.3 F (36.8 C) (Oral)   Resp 16  Notable VS details: reviewed  Fetal Assessment: difficulty tracing d/t maternal habitus; unable to place FSE d/t fetal head position; RN at bedside holding external fetal monitor  FHT:  FHR: 135 bpm, variability: moderate,  accelerations:  Abscent,  decelerations:  Absent Category/reactivity:   difficulty tracing UC:   regular, every 3-5 minutes, MVUs 150-215 SVE:    Dilation: 7cm  Effacement: 100%, edematous on patient's right  Station:  0  Consistency: soft  Position: middle  Membrane status:AROM @ 1221 Amniotic color: light meconium  Labs: Lab Results  Component Value Date   WBC 17.3 (H) 03/08/2022   HGB 12.0 03/08/2022   HCT 35.1 (L) 03/08/2022   MCV 88.0 03/08/2022   PLT 278 03/08/2022    Assessment / Plan: 32year old G1P0 at 451w4dith uterine contractions and active labor  Labor:  Recheck after 2 hours of position changes and IV benadryl and trendelenburg. Cervix unchanged. Fetal head less flexed than before, brow presentation. LOA. Discussed that her baby will need to flex her chin to deliver vaginally. Mother would like to continue position changes to allow fetal head to flex. Will continue pitocin titration with IUPC in place to adequate strength contractions and continue position changes. Dr. JaGlennon Macpdated on labor progress. Preeclampsia:  labs stable Fetal Wellbeing:  Category II, difficulty tracing, unable to place FSE d/t brow presentation. RN at bedside holding external monitor. No decelerations audible. Pain Control:  Epidural I/D:   GBS positive  DaGertie FeyCNM 03/08/2022, 10:00 PM

## 2022-03-09 ENCOUNTER — Other Ambulatory Visit: Payer: Self-pay

## 2022-03-09 ENCOUNTER — Encounter
Admission: EM | Disposition: A | Discharge status: Home / Self Care | Payer: Self-pay | Source: Home / Self Care | Attending: Certified Nurse Midwife

## 2022-03-09 ENCOUNTER — Encounter: Payer: Self-pay | Admitting: Obstetrics and Gynecology

## 2022-03-09 DIAGNOSIS — Z3A40 40 weeks gestation of pregnancy: Secondary | ICD-10-CM

## 2022-03-09 DIAGNOSIS — O0990 Supervision of high risk pregnancy, unspecified, unspecified trimester: Secondary | ICD-10-CM

## 2022-03-09 DIAGNOSIS — O99212 Obesity complicating pregnancy, second trimester: Secondary | ICD-10-CM | POA: Insufficient documentation

## 2022-03-09 DIAGNOSIS — O99210 Obesity complicating pregnancy, unspecified trimester: Secondary | ICD-10-CM | POA: Insufficient documentation

## 2022-03-09 LAB — CREATININE, SERUM
Creatinine, Ser: 0.74 mg/dL (ref 0.44–1.00)
GFR, Estimated: >60 mL/min (ref >60)

## 2022-03-09 LAB — CBC
HCT: 33 % — ABNORMAL LOW (ref 36.0–46.0)
Hemoglobin: 11.3 g/dL — ABNORMAL LOW (ref 12.0–15.0)
MCH: 30.5 pg (ref 26.0–34.0)
MCHC: 34.2 g/dL (ref 30.0–36.0)
MCV: 88.9 fL (ref 80.0–100.0)
Platelets: 254 10*3/uL (ref 150–400)
RBC: 3.71 MIL/uL — ABNORMAL LOW (ref 3.87–5.11)
RDW: 13.5 % (ref 11.5–15.5)
WBC: 20.8 10*3/uL — ABNORMAL HIGH (ref 4.0–10.5)
nRBC: 0 % (ref 0.0–0.2)

## 2022-03-09 SURGERY — Surgical Case
Anesthesia: Epidural

## 2022-03-09 MED ORDER — DEXMEDETOMIDINE HCL IN NACL 80 MCG/20ML IV SOLN
INTRAVENOUS | Status: DC | PRN
  Administered 2022-03-09 (×3): 4 ug via BUCCAL
  Administered 2022-03-09 (×2): 2 ug via BUCCAL
  Administered 2022-03-09 (×3): 4 ug via BUCCAL
  Administered 2022-03-09: 2 ug via BUCCAL

## 2022-03-09 MED ORDER — IBUPROFEN 600 MG PO TABS: 600.0000 mg | ORAL_TABLET | Freq: Four times a day (QID) | ORAL | Status: DC | PRN

## 2022-03-09 MED ORDER — PRENATAL MULTIVITAMIN CH
1.0000 | ORAL_TABLET | Freq: Every day | ORAL | Status: DC
  Administered 2022-03-09 – 2022-03-10 (×2): 1 via ORAL
  Filled 2022-03-09 (×2): qty 1

## 2022-03-09 MED ORDER — CEFAZOLIN SODIUM 1 G IJ SOLR
INTRAMUSCULAR | Status: AC
  Filled 2022-03-09: qty 10

## 2022-03-09 MED ORDER — OXYCODONE HCL 5 MG PO TABS: 5.0000 mg | ORAL_TABLET | Freq: Four times a day (QID) | ORAL | Status: DC | PRN

## 2022-03-09 MED ORDER — OXYCODONE-ACETAMINOPHEN 5-325 MG PO TABS
1.0000 | ORAL_TABLET | ORAL | Status: DC | PRN
  Administered 2022-03-10: 1 via ORAL
  Filled 2022-03-09: qty 1

## 2022-03-09 MED ORDER — SCOPOLAMINE 1 MG/3DAYS TD PT72
1.0000 | MEDICATED_PATCH | Freq: Once | TRANSDERMAL | Status: DC
  Administered 2022-03-09: 1.5 mg via TRANSDERMAL
  Filled 2022-03-09: qty 1

## 2022-03-09 MED ORDER — KETOROLAC TROMETHAMINE 30 MG/ML IJ SOLN
INTRAMUSCULAR | Status: AC
  Administered 2022-03-09: 30 mg via INTRAVENOUS
  Filled 2022-03-09: qty 1

## 2022-03-09 MED ORDER — VARICELLA VIRUS VACCINE LIVE 1350 PFU/0.5ML IJ SUSR: 0.5000 mL | INTRAMUSCULAR | Status: DC | PRN

## 2022-03-09 MED ORDER — PROMETHAZINE HCL 25 MG/ML IJ SOLN
INTRAMUSCULAR | Status: DC | PRN
  Administered 2022-03-09: 12.5 mg via INTRAVENOUS

## 2022-03-09 MED ORDER — SOD CITRATE-CITRIC ACID 500-334 MG/5ML PO SOLN
ORAL | Status: AC
  Filled 2022-03-09: qty 15

## 2022-03-09 MED ORDER — DIPHENHYDRAMINE HCL 25 MG PO CAPS: 25.0000 mg | ORAL_CAPSULE | Freq: Four times a day (QID) | ORAL | Status: DC | PRN

## 2022-03-09 MED ORDER — MENTHOL 3 MG MT LOZG: 1.0000 | LOZENGE | OROMUCOSAL | Status: DC | PRN

## 2022-03-09 MED ORDER — CEFAZOLIN IN SODIUM CHLORIDE 3-0.9 GM/100ML-% IV SOLN
3.0000 g | INTRAVENOUS | Status: AC
  Administered 2022-03-09: 3 g via INTRAVENOUS
  Filled 2022-03-09: qty 100

## 2022-03-09 MED ORDER — PROMETHAZINE HCL 25 MG/ML IJ SOLN
INTRAMUSCULAR | Status: AC
  Filled 2022-03-09: qty 1

## 2022-03-09 MED ORDER — PROPOFOL 10 MG/ML IV BOLUS
INTRAVENOUS | Status: AC
  Filled 2022-03-09: qty 20

## 2022-03-09 MED ORDER — LIDOCAINE HCL (PF) 2 % IJ SOLN
INTRAMUSCULAR | Status: AC
  Filled 2022-03-09: qty 20

## 2022-03-09 MED ORDER — BUPIVACAINE HCL (PF) 0.5 % IJ SOLN
INTRAMUSCULAR | Status: AC
  Filled 2022-03-09: qty 10

## 2022-03-09 MED ORDER — PANTOPRAZOLE SODIUM 40 MG PO TBEC
40.0000 mg | DELAYED_RELEASE_TABLET | Freq: Every day | ORAL | Status: DC
  Administered 2022-03-09 – 2022-03-11 (×3): 40 mg via ORAL
  Filled 2022-03-09 (×4): qty 1

## 2022-03-09 MED ORDER — SOD CITRATE-CITRIC ACID 500-334 MG/5ML PO SOLN: 30.0000 mL | ORAL | Status: DC

## 2022-03-09 MED ORDER — DIPHENHYDRAMINE HCL 50 MG/ML IJ SOLN: 12.5000 mg | INTRAMUSCULAR | Status: DC | PRN

## 2022-03-09 MED ORDER — FENTANYL CITRATE (PF) 100 MCG/2ML IJ SOLN
INTRAMUSCULAR | Status: DC | PRN
  Administered 2022-03-09 (×2): 25 ug via INTRAVENOUS
  Administered 2022-03-09: 50 ug via INTRAVENOUS

## 2022-03-09 MED ORDER — WITCH HAZEL-GLYCERIN EX PADS: 1.0000 | MEDICATED_PAD | CUTANEOUS | Status: DC | PRN

## 2022-03-09 MED ORDER — FERROUS SULFATE 325 (65 FE) MG PO TABS
325.0000 mg | ORAL_TABLET | Freq: Two times a day (BID) | ORAL | Status: DC
  Administered 2022-03-09 – 2022-03-11 (×4): 325 mg via ORAL
  Filled 2022-03-09 (×5): qty 1

## 2022-03-09 MED ORDER — KETAMINE HCL 50 MG/ML IJ SOLN
INTRAMUSCULAR | Status: AC
  Filled 2022-03-09: qty 10

## 2022-03-09 MED ORDER — PHENYLEPHRINE HCL (PRESSORS) 10 MG/ML IV SOLN
INTRAVENOUS | Status: DC | PRN
  Administered 2022-03-09: 80 ug via INTRAVENOUS

## 2022-03-09 MED ORDER — SODIUM CHLORIDE (PF) 0.9 % IJ SOLN
INTRAMUSCULAR | Status: AC
  Filled 2022-03-09: qty 10

## 2022-03-09 MED ORDER — MIDAZOLAM HCL 2 MG/2ML IJ SOLN
INTRAMUSCULAR | Status: AC
  Filled 2022-03-09: qty 2

## 2022-03-09 MED ORDER — SODIUM CHLORIDE 0.9 % IV SOLN
500.0000 mg | INTRAVENOUS | Status: AC
  Administered 2022-03-09: 500 mg via INTRAVENOUS

## 2022-03-09 MED ORDER — FENTANYL CITRATE (PF) 100 MCG/2ML IJ SOLN
INTRAMUSCULAR | Status: AC
  Filled 2022-03-09: qty 2

## 2022-03-09 MED ORDER — ACETAMINOPHEN 325 MG PO TABS: 650.0000 mg | ORAL_TABLET | Freq: Once | ORAL | Status: AC

## 2022-03-09 MED ORDER — IBUPROFEN 600 MG PO TABS
600.0000 mg | ORAL_TABLET | Freq: Four times a day (QID) | ORAL | Status: DC
  Administered 2022-03-10 – 2022-03-11 (×4): 600 mg via ORAL
  Filled 2022-03-09 (×4): qty 1

## 2022-03-09 MED ORDER — OXYTOCIN-SODIUM CHLORIDE 30-0.9 UT/500ML-% IV SOLN: 2.5000 [IU]/h | INTRAVENOUS | Status: AC

## 2022-03-09 MED ORDER — ACETAMINOPHEN 325 MG PO TABS
ORAL_TABLET | ORAL | Status: AC
  Administered 2022-03-09: 650 mg via ORAL
  Filled 2022-03-09: qty 2

## 2022-03-09 MED ORDER — MIDAZOLAM HCL 2 MG/2ML IJ SOLN
INTRAMUSCULAR | Status: DC | PRN
  Administered 2022-03-09 (×2): 1 mg via INTRAVENOUS

## 2022-03-09 MED ORDER — KETOROLAC TROMETHAMINE 30 MG/ML IJ SOLN
30.0000 mg | Freq: Four times a day (QID) | INTRAMUSCULAR | Status: AC
  Administered 2022-03-09 – 2022-03-10 (×3): 30 mg via INTRAVENOUS
  Filled 2022-03-09 (×3): qty 1

## 2022-03-09 MED ORDER — OXYCODONE-ACETAMINOPHEN 5-325 MG PO TABS
ORAL_TABLET | ORAL | Status: AC
  Administered 2022-03-09: 1
  Filled 2022-03-09: qty 1

## 2022-03-09 MED ORDER — BUPIVACAINE 0.25 % ON-Q PUMP DUAL CATH 400 ML
400.0000 mL | INJECTION | Status: DC
  Filled 2022-03-09: qty 400

## 2022-03-09 MED ORDER — DIPHENHYDRAMINE HCL 25 MG PO CAPS: 25.0000 mg | ORAL_CAPSULE | ORAL | Status: DC | PRN

## 2022-03-09 MED ORDER — OXYCODONE HCL 5 MG PO TABS
ORAL_TABLET | ORAL | Status: AC
  Filled 2022-03-09: qty 1

## 2022-03-09 MED ORDER — DEXMEDETOMIDINE HCL IN NACL 80 MCG/20ML IV SOLN
INTRAVENOUS | Status: AC
  Filled 2022-03-09: qty 20

## 2022-03-09 MED ORDER — LIDOCAINE 2% (20 MG/ML) 5 ML SYRINGE
INTRAMUSCULAR | Status: DC | PRN
  Administered 2022-03-09: 5 mL via INTRAVENOUS
  Administered 2022-03-09: 4 mL via INTRAVENOUS
  Administered 2022-03-09: 6 mL via INTRAVENOUS
  Administered 2022-03-09: 5 mL via INTRAVENOUS

## 2022-03-09 MED ORDER — DIBUCAINE (PERIANAL) 1 % EX OINT: 1.0000 | TOPICAL_OINTMENT | CUTANEOUS | Status: DC | PRN

## 2022-03-09 MED ORDER — ONDANSETRON HCL 4 MG/2ML IJ SOLN
INTRAMUSCULAR | Status: AC
  Filled 2022-03-09: qty 2

## 2022-03-09 MED ORDER — NALOXONE HCL 0.4 MG/ML IJ SOLN: 0.4000 mg | INTRAMUSCULAR | Status: DC | PRN

## 2022-03-09 MED ORDER — OXYCODONE HCL 5 MG PO TABS
ORAL_TABLET | ORAL | Status: AC
  Filled 2022-03-09: qty 2

## 2022-03-09 MED ORDER — KETAMINE HCL 50 MG/ML IJ SOLN
INTRAMUSCULAR | Status: DC | PRN
  Administered 2022-03-09 (×2): 25 mg via INTRAMUSCULAR
  Administered 2022-03-09: 50 mg via INTRAMUSCULAR

## 2022-03-09 MED ORDER — VENLAFAXINE HCL ER 37.5 MG PO CP24
37.5000 mg | ORAL_CAPSULE | Freq: Every day | ORAL | Status: DC
  Administered 2022-03-10: 37.5 mg via ORAL
  Filled 2022-03-09 (×3): qty 1

## 2022-03-09 MED ORDER — ENOXAPARIN SODIUM 60 MG/0.6ML IJ SOSY
60.0000 mg | PREFILLED_SYRINGE | INTRAMUSCULAR | Status: DC
  Administered 2022-03-10 – 2022-03-11 (×2): 60 mg via SUBCUTANEOUS
  Filled 2022-03-09 (×2): qty 0.6

## 2022-03-09 MED ORDER — SIMETHICONE 80 MG PO CHEW
80.0000 mg | CHEWABLE_TABLET | Freq: Three times a day (TID) | ORAL | Status: DC
  Administered 2022-03-09 – 2022-03-11 (×6): 80 mg via ORAL
  Filled 2022-03-09 (×6): qty 1

## 2022-03-09 MED ORDER — OXYTOCIN-SODIUM CHLORIDE 30-0.9 UT/500ML-% IV SOLN
INTRAVENOUS | Status: AC
  Filled 2022-03-09: qty 500

## 2022-03-09 MED ORDER — MEASLES, MUMPS & RUBELLA VAC IJ SOLR
0.5000 mL | Freq: Once | INTRAMUSCULAR | Status: DC
  Filled 2022-03-09: qty 0.5

## 2022-03-09 MED ORDER — SUCCINYLCHOLINE CHLORIDE 200 MG/10ML IV SOSY
PREFILLED_SYRINGE | INTRAVENOUS | Status: AC
  Filled 2022-03-09: qty 10

## 2022-03-09 MED ORDER — PHENYLEPHRINE HCL-NACL 20-0.9 MG/250ML-% IV SOLN
INTRAVENOUS | Status: DC | PRN
  Administered 2022-03-09: 25 ug/min via INTRAVENOUS

## 2022-03-09 MED ORDER — SODIUM CHLORIDE 0.9 % IV SOLN
INTRAVENOUS | Status: AC
  Filled 2022-03-09: qty 5

## 2022-03-09 MED ORDER — OXYCODONE-ACETAMINOPHEN 5-325 MG PO TABS: 1.0000 | ORAL_TABLET | ORAL | Status: DC | PRN

## 2022-03-09 MED ORDER — ONDANSETRON HCL 4 MG/2ML IJ SOLN
INTRAMUSCULAR | Status: DC | PRN
  Administered 2022-03-09: 4 mg via INTRAVENOUS

## 2022-03-09 MED ORDER — SENNOSIDES-DOCUSATE SODIUM 8.6-50 MG PO TABS
2.0000 | ORAL_TABLET | ORAL | Status: DC
  Administered 2022-03-10 – 2022-03-11 (×2): 2 via ORAL
  Filled 2022-03-09 (×2): qty 2

## 2022-03-09 MED ORDER — NALOXONE HCL 4 MG/10ML IJ SOLN
1.0000 ug/kg/h | INTRAVENOUS | Status: DC | PRN
  Filled 2022-03-09: qty 5

## 2022-03-09 MED ORDER — ONDANSETRON HCL 4 MG/2ML IJ SOLN: 4.0000 mg | Freq: Three times a day (TID) | INTRAMUSCULAR | Status: DC | PRN

## 2022-03-09 MED ORDER — CEFAZOLIN SODIUM-DEXTROSE 2-4 GM/100ML-% IV SOLN
INTRAVENOUS | Status: AC
  Filled 2022-03-09: qty 100

## 2022-03-09 MED ORDER — ACETAMINOPHEN 500 MG PO TABS
1000.0000 mg | ORAL_TABLET | Freq: Four times a day (QID) | ORAL | Status: AC
  Administered 2022-03-09 – 2022-03-10 (×4): 1000 mg via ORAL
  Filled 2022-03-09 (×4): qty 2

## 2022-03-09 MED ORDER — NIFEDIPINE ER OSMOTIC RELEASE 30 MG PO TB24
30.0000 mg | ORAL_TABLET | Freq: Every day | ORAL | Status: DC
  Administered 2022-03-09 – 2022-03-11 (×3): 30 mg via ORAL
  Filled 2022-03-09 (×3): qty 1

## 2022-03-09 MED ORDER — LACTATED RINGERS IV SOLN: INTRAVENOUS | Status: DC

## 2022-03-09 MED ORDER — COCONUT OIL OIL: 1.0000 | TOPICAL_OIL | Status: DC | PRN

## 2022-03-09 MED ORDER — SODIUM CHLORIDE 0.9% FLUSH: 3.0000 mL | INTRAVENOUS | Status: DC | PRN

## 2022-03-09 SURGICAL SUPPLY — 41 items
CATH KIT ON-Q SILVERSOAK 5 (CATHETERS) ×2 IMPLANT
CATH KIT ON-Q SILVERSOAK 5IN (CATHETERS) ×2 IMPLANT
DERMABOND ADVANCED .7 DNX12 (GAUZE/BANDAGES/DRESSINGS) ×1 IMPLANT
DRESSING PEEL AND PLAC PRVNA20 (GAUZE/BANDAGES/DRESSINGS) IMPLANT
DRSG OPSITE POSTOP 4X10 (GAUZE/BANDAGES/DRESSINGS) ×1 IMPLANT
DRSG PEEL AND PLACE PREVENA 20 (GAUZE/BANDAGES/DRESSINGS) ×1
DRSG TELFA 3X8 NADH STRL (GAUZE/BANDAGES/DRESSINGS) ×1 IMPLANT
ELECT CAUTERY BLADE 6.4 (BLADE) ×1 IMPLANT
ELECT REM PT RETURN 9FT ADLT (ELECTROSURGICAL) ×1
ELECTRODE REM PT RTRN 9FT ADLT (ELECTROSURGICAL) ×1 IMPLANT
GAUZE SPONGE 4X4 12PLY STRL (GAUZE/BANDAGES/DRESSINGS) ×1 IMPLANT
GLOVE BIO SURGEON STRL SZ7 (GLOVE) ×1 IMPLANT
GLOVE SURG UNDER LTX SZ7.5 (GLOVE) ×1 IMPLANT
GOWN STRL REUS W/ TWL LRG LVL3 (GOWN DISPOSABLE) ×3 IMPLANT
GOWN STRL REUS W/TWL LRG LVL3 (GOWN DISPOSABLE) ×3
KIT PREVENA INCISION MGT20CM45 (CANNISTER) IMPLANT
MANIFOLD NEPTUNE II (INSTRUMENTS) ×1 IMPLANT
MAT PREVALON FULL STRYKER (MISCELLANEOUS) ×1 IMPLANT
NS IRRIG 1000ML POUR BTL (IV SOLUTION) ×1 IMPLANT
PACK C SECTION AR (MISCELLANEOUS) ×1 IMPLANT
PAD OB MATERNITY 4.3X12.25 (PERSONAL CARE ITEMS) ×2 IMPLANT
PAD PREP 24X41 OB/GYN DISP (PERSONAL CARE ITEMS) ×1 IMPLANT
RETRACTOR TRAXI PANNICULUS (MISCELLANEOUS) IMPLANT
RETRACTOR WND ALEXIS-O 25 LRG (MISCELLANEOUS) IMPLANT
RTRCTR WOUND ALEXIS O 25CM LRG (MISCELLANEOUS) ×1
SCRUB CHG 4% DYNA-HEX 4OZ (MISCELLANEOUS) ×1 IMPLANT
STAPLER INSORB 30 2030 C-SECTI (MISCELLANEOUS) IMPLANT
STRIP CLOSURE SKIN 1/2X4 (GAUZE/BANDAGES/DRESSINGS) ×1 IMPLANT
SUT 3-0 VICRYL UNDYED CT1 36 (SUTURE) ×1
SUT MNCRL 4-0 (SUTURE) ×1
SUT MNCRL 4-0 27XMFL (SUTURE) ×1
SUT PDS AB 1 TP1 96 (SUTURE) ×1 IMPLANT
SUT PLAIN GUT 0 (SUTURE) IMPLANT
SUT VIC AB 0 CTX 36 (SUTURE) ×2
SUT VIC AB 0 CTX36XBRD ANBCTRL (SUTURE) ×2 IMPLANT
SUT VICRYL 3-0 27IN SH (SUTURE) IMPLANT
SUTURE 3-0 VICRYL UNDYD CT1 36 (SUTURE) IMPLANT
SUTURE MNCRL 4-0 27XMF (SUTURE) ×1 IMPLANT
SWABSTK COMLB BENZOIN TINCTURE (MISCELLANEOUS) ×1 IMPLANT
TRAP FLUID SMOKE EVACUATOR (MISCELLANEOUS) ×1 IMPLANT
WATER STERILE IRR 500ML POUR (IV SOLUTION) ×1 IMPLANT

## 2022-03-09 NOTE — Discharge Summary (Signed)
Obstetrical Discharge Summary  Patient Name: Terri Bradshaw DOB: [birth year] MRN: [id number]  Date of Admission: 03/08/2022 Date of Delivery: 03/09/22 Delivered by: Prentice Docker, MD Date of Discharge: 03/11/2022  Primary OB: [facility] LMP:No LMP recorded. EDC Estimated Date of Delivery: 03/04/22 Gestational Age at Delivery: [redacted]w[redacted]d  Antepartum complications:  1. Obesity, BMI 53 2. GBS positive 3. Recurrent asymptomatic bacteruria 4. Anxiety 5. Rubella and varicella non-immune  Admitting Diagnosis: Uterine contractions [O47.9]  Secondary Diagnosis: Patient Active Problem List   Diagnosis Date Noted   Supervision of high-risk pregnancy 03/09/2022   Obesity affecting pregnancy 03/09/2022   [redacted] weeks gestation of pregnancy 03/09/2022   Uterine contractions 03/08/2022    Discharge Diagnosis: Term Pregnancy Delivered      Augmentation: AROM and Pitocin Complications: None Intrapartum complications/course: She arrived with labor contractions and rapidly progressed from 1cm to 5cm. Slow progression over the next 16hrs to 7cm where fetal head rotated to face presentation, mentum posterior. Decision made for primary cesarean section. See operative note for more details.  Delivery Type: primary cesarean section, low transverse incision Anesthesia: epidural anesthesia Placenta: manual removal To Pathology: No  Laceration: n/a Episiotomy: none Newborn Data: Live born female "EMargaretha Sheffield Birth Weight: 8 lb 15.2 oz (4060 g) APGAR: 8, 9  Newborn Delivery   Birth date/time: 03/09/2022 02:20:00 Delivery type:       Postpartum Procedures: none Edinburgh:     03/10/2022    4:00 PM  Edinburgh Postnatal Depression Scale Screening Tool  I have been able to laugh and see the funny side of things. 0  I have looked forward with enjoyment to things. 0  I have blamed myself unnecessarily when things went wrong. 3  I have been anxious or worried for no good reason. 1  I  have felt scared or panicky for no good reason. 0  Things have been getting on top of me. 0  I have been so unhappy that I have had difficulty sleeping. 0  I have felt sad or miserable. 0  I have been so unhappy that I have been crying. 1  The thought of harming myself has occurred to me. 0  Edinburgh Postnatal Depression Scale Total 5     Post partum course:  Patient had an uncomplicated postpartum course.  By time of discharge on POD#2, her pain was controlled on oral pain medications; she had appropriate lochia and was ambulating, voiding without difficulty, tolerating regular diet and passing flatus.   She was deemed stable for discharge to home.    Discharge Physical Exam:  BP 117/82 (BP Location: Left Arm)   Pulse 85   Temp 97.6 F (36.4 C) (Oral)   Resp 18   Ht '5\' 6"'$  (1.676 m)   Wt (!) 136.5 kg   SpO2 97%   Breastfeeding Unknown   BMI 48.57 kg/m   General: NAD CV: RRR Pulm: CTABL, nl effort ABD: s/nd/nt, fundus firm and below the umbilicus Lochia: moderate Incision: c/d/I, covered with occlusive Provena dressing  DVT Evaluation: LE non-ttp, no evidence of DVT on exam.  Hemoglobin  Date Value Ref Range Status  03/10/2022 9.6 (L) 12.0 - 15.0 g/dL Final   HCT  Date Value Ref Range Status  03/10/2022 29.8 (L) 36.0 - 46.0 % Final    Risk assessment for postpartum VTE and prophylactic treatment: Very high risk factors: BMI > 50 kg/m2 High risk factors: Unscheduled cesarean after labor  Moderate risk factors: Cesarean delivery   Postpartum VTE prophylaxis  with LMWH ordered  Disposition: stable, discharge to home. Baby Feeding: breast feeding Baby Disposition: home with mom  Rh Immune globulin indicated: No Rubella vaccine given: was offered prior to discharge  Varivax vaccine given: was offered prior to discharge  Flu vaccine given in AP setting: Yes  Tdap vaccine given in AP setting: Yes   Contraception:  NFP  Prenatal Labs:  Blood type/Rh B positive   Antibody screen neg  Rubella Non-Immune  Varicella Non-Immune  RPR NR  HBsAg Neg  HIV NR  GC neg  Chlamydia neg  Genetic screening negative  1 hour GTT 103  3 hour GTT    GBS Pos    Plan:  Terri Bradshaw was discharged to home in good condition. Follow-up appointment with delivering provider in 1 week.  Discharge Medications: Allergies as of 03/11/2022       Reactions   Bactrim [sulfamethoxazole-trimethoprim]         Medication List     STOP taking these medications    azithromycin 250 MG tablet Commonly known as: Zithromax Z-Pak       TAKE these medications    desvenlafaxine 50 MG 24 hr tablet Commonly known as: PRISTIQ Take 1 tablet (50 mg total) by mouth daily. Take with '25mg'$  tablet for a total of '75mg'$ .   desvenlafaxine 25 MG 24 hr tablet Commonly known as: PRISTIQ Take 1 tablet (25 mg total) by mouth daily. Take with 50 mg tablet for a total of '75mg'$ .   EPINEPHrine 0.3 mg/0.3 mL Soaj injection Commonly known as: EPI-PEN Inject 0.3 mg into the muscle as needed for anaphylaxis.   ibuprofen 600 MG tablet Commonly known as: ADVIL Take 1 tablet (600 mg total) by mouth every 6 (six) hours as needed for mild pain or cramping.   NIFEdipine 30 MG 24 hr tablet Commonly known as: ADALAT CC Take 1 tablet (30 mg total) by mouth daily. Start taking on: March 12, 2022   oxyCODONE 5 MG immediate release tablet Commonly known as: Oxy IR/ROXICODONE Take 1 tablet (5 mg total) by mouth every 4 (four) hours as needed for up to 7 days for severe pain.   pantoprazole 40 MG tablet Commonly known as: Protonix Take 1 tablet (40 mg total) by mouth daily.   Prenatal Vitamins 28-0.8 MG Tabs Take 1 tablet by mouth daily.   promethazine-dextromethorphan 6.25-15 MG/5ML syrup Commonly known as: PROMETHAZINE-DM Take 5 mLs by mouth 4 (four) times daily as needed for cough.   ZyrTEC Allergy 10 MG tablet Generic drug: cetirizine Take 10 mg by mouth at bedtime.          Follow-up Information     Will Bonnet, MD. Schedule an appointment as soon as possible for a visit in 1 week(s).   Specialty: Obstetrics and Gynecology Why: For incision check and wound vac removal Contact information: [place] [address] [phone]                 Signed: Terance Ice 03/11/2022 10:45 AM  Drinda Butts, CNM Certified Nurse Midwife [facility]

## 2022-03-09 NOTE — Progress Notes (Signed)
Labor Progress Note  Terri Bradshaw is a 32 y.o. G1P0 at 28w5dby LMP admitted for active labor  Subjective: she has tried every position that has been recommended to help with fetal positioning  Objective: BP 134/78   Pulse 91   Temp 98.3 F (36.8 C) (Oral)   Resp 16  Notable VS details: reviewed  Fetal Assessment: FHT:  FHR: 140 bpm, variability: moderate,  accelerations:  Present,  decelerations:  Absent Category/reactivity:  Category I UC:   regular, every 3-4 minutes SVE:    Dilation: 7cm  Effacement: 100%  Station:  0  Consistency: soft  Position: middle  Membrane status:AROM @ 1221 Amniotic color: light meconium  Labs: Lab Results  Component Value Date   WBC 17.3 (H) 03/08/2022   HGB 12.0 03/08/2022   HCT 35.1 (L) 03/08/2022   MCV 88.0 03/08/2022   PLT 278 03/08/2022    Assessment / Plan: 32year old G1P0 at 421w4dith uterine contractions and active labor  Labor:  She has not made any cervical change since 1630. Have tried increasing pitocin until adequate MVUs and attempted position changes to facilitate fetal rotation and descent. Fetal head has been OA but has changed from vertex to miAlbao brow and now face presentation. Right mentum posterior. Discussed with the patient that a baby with a face presentation of mentum posterior is unable to be born vaginally. We have tried various position changes without the desired fetal repositioning. Recommended a primary cesarean section for fetal malposition. She is agreeable. Called Dr. JaGlennon Macnd requested he come evaluate the patient for a primary cesarean section.  Pitocin turned off.  Preeclampsia:  labs stable Fetal Wellbeing:  Category I Pain Control:  Epidural I/D:   GBS positive, treated 4 times Anticipated MOD:   primary cesarean section  DaGertie FeyCNM 03/09/2022, 12:48 AM

## 2022-03-09 NOTE — Lactation Note (Incomplete)
Lactation Consultation Note  Patient Name: Terri Bradshaw [id number] Date: 03/09/2022 Age:32 y.o.     Maternal Data    Feeding    LATCH Score                    Lactation Tools Discussed/Used    Interventions    Discharge    Consult Status      Rodena Medin 03/09/2022, 1:46 PM

## 2022-03-09 NOTE — Progress Notes (Signed)
Patient with only small cervical change since admission 16 hours ago.  She had gone from 5 cm to now 7 cm. She has been 7 cm since at least 12:25 pm, now greater than 12 hours ago. She has had AROM, pitocin titration and with an IUPC has had adequate contractions for about 2 hours.  She has a face presentation at this point and no cervical change.  After discussing with the patient, decision made to proceed with cesarean delivery.  Fetal tracing is currently category 1.  Will proceed with abdominal delivery as soon as possible.   I personally consented patient for procedure and she agrees to proceed.  Prentice Docker, MD, [facility] 03/09/2022 1:04 AM

## 2022-03-09 NOTE — Op Note (Signed)
Cesarean Section Operative Note    Patient Name: Terri Bradshaw  Date of Birth: [birth year]  MRN: [id number]  Date of Surgery: 03/09/2022   Pre-operative Diagnosis:  1) Arrest of labor 2) intrauterine pregnancy at [redacted]w[redacted]d  Post-operative Diagnosis:  1) Arrest of labor 2) intrauterine pregnancy at 483w5d  Procedure: Primary Low Transverse Cesarean Section  Surgeon: Surgeon(s) and Role:    * Will BonnetMD - Primary   Assistants: DaLucrezia EuropeCNM; No other capable assistant available, in surgery requiring high level assistant.  Anesthesia: epidural   Findings:  1) normal appearing gravid uterus, fallopian tubes, and ovaries 2) viable female infant with weight 4,060 grams, APGARs 8 and 9   Estimated Blood Loss: 1,000 mL  Total IV Fluids: 800 ml   Urine Output:  200 mL  Specimens: none  Complications: no complications  Disposition: PACU - hemodynamically stable.   Maternal Condition: stable   Baby condition / location:  Couplet care / Skin to Skin  Procedure Details:  The patient was seen in the Holding Room. The risks, benefits, complications, treatment options, and expected outcomes were discussed with the patient. The patient concurred with the proposed plan, giving informed consent. identified as TaMeredith Staggersnd the procedure verified as C-Section Delivery. A Time Out was held and the above information confirmed.   After induction of anesthesia, the patient was draped and prepped in the usual sterile manner. A Pfannenstiel incision was made and carried down through the subcutaneous tissue to the fascia. Fascial incision was made and extended transversely. The fascia was separated from the underlying rectus tissue superiorly and inferiorly. The peritoneum was identified and entered. Peritoneal incision was extended longitudinally. An Alexis retractor was placed with care taken to ensure no bowel was included in the ring of the retractor. The  bladder flap was not bluntly not sharply freed from the lower uterine segment. A low transverse uterine incision was made and the hysterotomy was extended with cranial-caudal tension. Delivered from cephalic presentation was a 4,060 gram Living newborn infant(s) or Female with Apgar scores of 8 at one minute and 9 at five minutes. Cord ph was not sent the umbilical cord was clamped and cut cord blood was not obtained for evaluation. The placenta was removed Intact and appeared normal. The uterine incision was closed in situ with running locked sutures of 0 Vicryl.  A second layer of the same suture was thrown in an imbricating fashion.  Hemostasis was assured.  The rectus muscles were inspected and found to be hemostatic.  The fascia was then reapproximated with running sutures of 1-0 PDS, looped. The subcutaneous tissue was reapproximated using 2-0 plain gut such that no greater than 2cm of dead space remained. The subcuticular closure was performed using absorbable staples. The skin closure was reinforced using surgical skin glue.  The Provena wound vac was placed in accordance with the manufacturer's recommendations. There was a good seal noted upon placement.    The surgical assistant performed tissue retraction, assistance with suturing, and fundal pressure.  Instrument, sponge, and needle counts were correct prior the abdominal closure and were correct at the conclusion of the case.  The patient received Ancef 3 gram IV and Azithromycin 500 mg prior to skin incision (within 30 minutes). She had a vaginal prep, as well, prior to the case.  For VTE prophylaxis she was wearing SCDs throughout the case.  The assistant surgeon was a CNM due to lack of availability of another  Counselling psychologist.   Signed: Will Bonnet, MD 03/09/2022 3:28 AM

## 2022-03-09 NOTE — Hospital Course (Signed)
Risk assessment for postpartum VTE and prophylactic treatment: Very high risk factors: If any risk factors: 6 weeks LMHW and BMI > 50 kg/m2 (1 risk factor) High risk factors: If > 1 risk factor OR 1 risk factor + 1 moderate risk factor: 3-6 weeks of LMWH and Unscheduled cesarean after labor  Moderate risk factors: None  Postpartum VTE prophylaxis with LMWH  indicated

## 2022-03-09 NOTE — Lactation Note (Signed)
This note was copied from a baby's chart. Lactation Consultation Note  Patient Name: Terri Bradshaw [id number] Date: 03/09/2022 Age:32 hours Reason for consult: Initial assessment;Mother's request;Breastfeeding assistance   Maternal Data Pt reports first experience breastfeeding and first c-section. Baby is 69 hours old at time of consult and is demonstrating sleepy behavior with limited hunger cues present at time of consult. Pt states that she is unsure of how to latch and position infant comfortably due to incision from surgery. Has patient been taught Hand Expression?: Yes Does the patient have breastfeeding experience prior to this delivery?: No  Feeding Mother's Current Feeding Choice: Breast Milk LC discussed normative infant behavior for first 24 hours of life, feeding intervals and hunger cues, cluster feeding, and infant stomach size capacity.                 Lactation Tools Discussed/Used   LC discussed pillow positioning for comfort in football hold as well as nipple stimulation and tissue compression.  Interventions Interventions: Skin to skin;Hand express;Adjust position;Support pillows;Infant Driven Feeding Algorithm education;Breast feeding basics reviewed  Discharge Discharge Education: Warning signs for feeding baby Pump: Personal (Pt request LC assessment of personal Spectra flange for appropriate fit. Declined hand pump at this time.)  Consult Status Consult Status: Follow-up Date: 03/09/22 (Follow up with LC as 24 hr/clusterfeeding window approaches) Follow-up type: In-patient    Rodena Medin 03/09/2022, 1:22 PM

## 2022-03-09 NOTE — Transfer of Care (Addendum)
Immediate Anesthesia Transfer of Care Note  Patient: Terri Bradshaw  Procedure(s) Performed: CESAREAN SECTION  Patient Location: PACU  Anesthesia Type:Epidural  Level of Consciousness: drowsy, pt sleeping  Airway & Oxygen Therapy: Patient Spontanous Breathing and Patient connected to nasal cannula oxygen  Post-op Assessment: Report given to RN and Post -op Vital signs reviewed and stable  Post vital signs: Reviewed and stable  Last Vitals:  Vitals Value Taken Time  BP    Temp    Pulse    Resp    SpO2      Last Pain:  Vitals:   03/10/22 0720  TempSrc: Oral  PainSc: 2       Patients Stated Pain Goal: 0 (50/03/70 4888)  Complications: No notable events documented.

## 2022-03-10 ENCOUNTER — Encounter: Payer: Self-pay | Admitting: Obstetrics and Gynecology

## 2022-03-10 LAB — CBC
HCT: 29.8 % — ABNORMAL LOW (ref 36.0–46.0)
Hemoglobin: 9.6 g/dL — ABNORMAL LOW (ref 12.0–15.0)
MCH: 29.7 pg (ref 26.0–34.0)
MCHC: 32.2 g/dL (ref 30.0–36.0)
MCV: 92.3 fL (ref 80.0–100.0)
Platelets: 212 10*3/uL (ref 150–400)
RBC: 3.23 MIL/uL — ABNORMAL LOW (ref 3.87–5.11)
RDW: 13.9 % (ref 11.5–15.5)
WBC: 11.2 10*3/uL — ABNORMAL HIGH (ref 4.0–10.5)
nRBC: 0 % (ref 0.0–0.2)

## 2022-03-10 NOTE — Anesthesia Postprocedure Evaluation (Signed)
Anesthesia Post Note  Patient: Terri Bradshaw  Procedure(s) Performed: [place]  Patient location during evaluation: Mother Baby Anesthesia Type: Epidural Level of consciousness: awake and alert Pain management: pain level controlled Vital Signs Assessment: post-procedure vital signs reviewed and stable Respiratory status: spontaneous breathing, nonlabored ventilation and respiratory function stable Cardiovascular status: stable Postop Assessment: no headache, no backache and epidural receding Anesthetic complications: no   No notable events documented.   Last Vitals:  Vitals:   03/10/22 0323 03/10/22 0720  BP: (!) 134/90 126/82  Pulse: 89 86  Resp: 18 18  Temp: 36.5 C 36.5 C  SpO2: 97% 100%    Last Pain:  Vitals:   03/10/22 0720  TempSrc: Oral  PainSc: 2                  Akio Hudnall Lorenza Chick

## 2022-03-10 NOTE — Lactation Note (Signed)
This note was copied from a baby's chart. Lactation Consultation Note  Patient Name: Terri Bradshaw [id number] Date: 03/10/2022 Age:32 hours Reason for consult: Follow-up assessment;Primapara;Term;Breastfeeding assistance   Maternal Data This is mom's 1st baby, C/S for failure to progress. Mom with history of anxiety and obesity.  On follow-up today mom requested assistance with breastfeeding.  Has patient been taught Hand Expression?: Yes Does the patient have breastfeeding experience prior to this delivery?: No  Feeding Mother's Current Feeding Choice: Breast Milk  Assisted mom with positioning baby in the football hold. Mom was attempting to breastfeed baby in the football hold without any support pillows. Provided mom pillows and assisted mom with breastfeeding. Reviewed tips and strategies to maximize position and latch technique. Baby with upper lip slightly rolled inward. Mom was able to correct latch. Baby with multiple audible swallows noted.  LATCH Score Latch: Grasps breast easily, tongue down, lips flanged, rhythmical sucking.  Audible Swallowing: Spontaneous and intermittent  Type of Nipple: Everted at rest and after stimulation  Comfort (Breast/Nipple): Soft / non-tender  Hold (Positioning): Assistance needed to correctly position infant at breast and maintain latch.  LATCH Score: 9   Interventions Interventions: Breast feeding basics reviewed;Assisted with latch;Breast massage;Hand express;Adjust position;Breast compression;Support pillows;Position options;Education  Discharge Discharge Education: Engorgement and breast care;Warning signs for feeding baby;Outpatient recommendation Pump: Personal  Consult Status Consult Status: PRN Date: 03/10/22 Follow-up type: In-patient  Update provided to care nurse.  Jonna Nicolena Schurman 03/10/2022, 12:44 PM

## 2022-03-10 NOTE — Progress Notes (Addendum)
Post Partum Day 1  Subjective: Doing well, no concerns. Ambulating without difficulty, pain managed with PO meds, tolerating regular diet, and voiding without difficulty.   No fever/chills, chest pain, shortness of breath, nausea/vomiting, or leg pain. No nipple or breast pain. No headache, visual changes, or RUQ/epigastric pain.  Objective: BP 126/82 (BP Location: Left Arm)   Pulse 86   Temp 97.7 F (36.5 C) (Oral)   Resp 18   Ht '5\' 6"'$  (1.676 m)   Wt (!) 136.5 kg   SpO2 100%   Breastfeeding Unknown   BMI 48.57 kg/m    Physical Exam:  General: alert and cooperative Breasts: soft/nontender CV: RRR Pulm: nl effort Abdomen: soft, non-tender Uterine Fundus: firm Incision: healing well Perineum: intact Lochia: appropriate DVT Evaluation: No evidence of DVT seen on physical exam. [place]:      No data to display           Recent Labs    03/09/22 0554 03/10/22 0602  HGB 11.3* 9.6*  HCT 33.0* 29.8*  WBC 20.8* 11.2*  PLT 254 212    Assessment/Plan: 31 y.o. G1P1001 postpartum day # 1  Elevated BPs - Procardia '30mg'$  QD started yesterday Vitals:   03/09/22 0430 03/09/22 0500 03/09/22 0530 03/09/22 0535  BP: (!) 147/85 (!) 154/86 (!) 158/90 (!) 141/86   03/09/22 0900 03/09/22 1100 03/09/22 1200 03/09/22 1514  BP: (!) 142/89 114/80 132/86 127/84   03/09/22 1915 03/09/22 2301 03/10/22 0323 03/10/22 0720  BP: 120/79 (!) 140/87 (!) 134/90 126/82  - Output is adequate   -Continue routine postpartum care -Lactation consult PRN for breastfeeding   -Acute blood loss anemia - hemodynamically stable and asymptomatic; start PO ferrous sulfate BID with stool softeners  -Immunization status: Needs varicella, MMR, flu prior to discharge  Disposition: Continue inpatient postpartum care    LOS: 2 days   Jamin Panther, CNM 03/10/2022, 8:32 AM

## 2022-03-11 ENCOUNTER — Other Ambulatory Visit: Payer: Self-pay | Admitting: Family

## 2022-03-11 MED ORDER — IBUPROFEN 600 MG PO TABS: 600.0000 mg | ORAL_TABLET | Freq: Four times a day (QID) | ORAL | 0 refills | Status: DC | PRN

## 2022-03-11 MED ORDER — IBUPROFEN 600 MG PO TABS: 600.0000 mg | ORAL_TABLET | Freq: Four times a day (QID) | ORAL | Status: DC

## 2022-03-11 MED ORDER — NIFEDIPINE ER 30 MG PO TB24: 30.0000 mg | ORAL_TABLET | Freq: Every day | ORAL | 1 refills | Status: DC

## 2022-03-11 MED ORDER — OXYCODONE HCL 5 MG PO TABS: 5.0000 mg | ORAL_TABLET | ORAL | 0 refills | Status: AC | PRN

## 2022-03-11 NOTE — Progress Notes (Signed)
Patient discharged home with family.  Discharge instructions, when to follow up, and prescriptions reviewed with patient.  Patient verbalized understanding. Patient will be escorted out by auxiliary.   

## 2022-03-11 NOTE — Lactation Note (Signed)
This note was copied from a baby's chart. Lactation Consultation Note  Patient Name: Terri Bradshaw [id number] Date: 03/11/2022 Age:32 hours Reason for consult: Follow-up assessment;RN request  Lactation follow-up. Concern re: no void in several hours following cluster feeding. Neo at bedside.  Maternal Data Has patient been taught Hand Expression?: Yes Does the patient have breastfeeding experience prior to this delivery?: No  Feeding Mother's Current Feeding Choice: Breast Milk  LC called in for feeding observation/assessment. Mom has been primarily independent with her feedings. Baby awake/alert following Neo's assessment. LC observed baby being brought to the breast in football hold on the L side. LC helped slightly with baby's hand in the way of the breast as mom latched her. No assistance needed with positioning/latch.  Initially baby 5-6:1 suckle/swallow ration that increased to 3-4:1 ratio over the course of the feeding. Loud audible swallows heard by both LC and Neo at bedside. Baby visibly became more relaxed throughout the feeding and eventually did not respond to stimulus and feeding was ended at 12 minutes.  LATCH Score Latch: Grasps breast easily, tongue down, lips flanged, rhythmical sucking.  Audible Swallowing: Spontaneous and intermittent  Type of Nipple: Everted at rest and after stimulation  Comfort (Breast/Nipple): Soft / non-tender  Hold (Positioning): No assistance needed to correctly position infant at breast.  LATCH Score: 10   Lactation Tools Discussed/Used    Interventions Interventions: Breast feeding basics reviewed;Hand express;Pre-pump if needed;DEBP;Education  Reviewed cluster feeding/feeding patterns and behaviors, early cues and feeding on demand. Tips given for keeping baby awake/alert at the breast for full feeding. Educated on normal course of lactation and milk supply and demand. Guidance given for identifying transfer and softening of  the breast tissue post feeds.  Discharge Discharge Education: Engorgement and breast care;Warning signs for feeding baby;Outpatient recommendation  Consult Status Consult Status: Complete  Outpatient number provided in the discharge paperwork.  Lavonia Drafts 03/11/2022, 11:32 AM

## 2022-03-11 NOTE — Discharge Instructions (Signed)
Cesarean Delivery, Care After Refer to this sheet in the next few weeks. These instructions provide you with information on caring for yourself after your procedure. Your health care provider may also give you specific instructions. Your treatment has been planned according to current medical practices, but problems sometimes occur. Call your health care provider if you have any problems or questions after you go home. HOME CARE INSTRUCTIONS  Please leave dressing on until your follow up appointment.  You may shower during this period but turn your back to the water so that the dressing does not get directly saturated by the water.    Only take over-the-counter or prescription medications as directed by your health care provider. Do not drink alcohol, especially if you are breastfeeding or taking medication to relieve pain. Do not  smoke tobacco. Continue to use good perineal care. Good perineal care includes: Wiping your perineum from front to back. Keeping your perineum clean. Check your surgical cut (incision) daily for increased redness, drainage, swelling, or separation of skin. Shower and clean your incision gently with soap and water every day, by letting warm and soapy water run over the incision, and then pat it dry. If your health care provider says it is okay, leave the incision uncovered. Use a bandage (dressing) if the incision is draining fluid or appears irritated. If the adhesive strips across the incision do not fall off within 7 days, carefully peel them off, after a shower. Hug a pillow when coughing or sneezing until your incision is healed. This helps to relieve pain. Do not use tampons, douches or have sexual intercourse, until your health care provider says it is okay. Wear a well-fitting bra that provides breast support. Limit wearing support panties or control-top hose. Drink enough fluids to keep your urine clear or pale yellow. Eat high-fiber foods such as whole grain  cereals and breads, brown rice, beans, and fresh fruits and vegetables every day. These foods may help prevent or relieve constipation. Resume activities such as climbing stairs, driving, lifting, exercising, or traveling as directed by your health care provider. Try to have someone help you with your household activities and your newborn for at least a few days after you leave the hospital. Rest as much as possible. Try to rest or take a nap when your newborn is sleeping. Increase your activities gradually. Do not lift more than 15lbs until directed by a provider. Keep all of your scheduled postpartum appointments. It is very important to keep your scheduled follow-up appointments. At these appointments, your health care provider will be checking to make sure that you are healing physically and emotionally. SEEK MEDICAL CARE IF:  You are passing large clots from your vagina. Save any clots to show your health care provider. You have a foul smelling discharge from your vagina. You have trouble urinating. You are urinating frequently. You have pain when you urinate. You have a change in your bowel movements. You have increasing redness, pain, or swelling near your incision. You have pus draining from your incision. Your incision is separating. You have painful, hard, or reddened breasts. You have a severe headache. You have blurred vision or see spots. You feel sad or depressed. You have thoughts of hurting yourself or your newborn. You have questions about your care, the care of your newborn, or medications. You are dizzy or light-headed. You have a rash. You have pain, redness, or swelling at the site of the removed intravenous access (IV) tube. You have nausea  or vomiting. You stopped breastfeeding and have not had a menstrual period within 12 weeks of stopping. You are not breastfeeding and have not had a menstrual period within 12 weeks of delivery. You have a fever. SEEK IMMEDIATE  MEDICAL CARE IF: You have persistent pain. You have chest pain. You have shortness of breath. You faint. You have leg pain. You have stomach pain. Your vaginal bleeding saturates 2 or more sanitary pads in 1 hour. MAKE SURE YOU:  Understand these instructions. Will watch your condition. Will get help right away if you are not doing well or get worse. Document Released: 09/19/2001 Document Revised: 05/14/2013 Document Reviewed: 08/25/2011 [facility] Patient Information 2015 [place], [place]. This information is not intended to replace advice given to you by your health care provider. Make sure you discuss any questions you have with your health care provider.

## 2022-03-24 NOTE — Addendum Note (Signed)
Addendum  created 03/24/22 0858 by Demetrius Charity, CRNA   Clinical Note Signed

## 2022-03-24 NOTE — Addendum Note (Signed)
Addendum  created 03/24/22 0854 by Demetrius Charity, CRNA   Intraprocedure Event edited

## 2022-04-09 ENCOUNTER — Ambulatory Visit: Payer: Self-pay

## 2022-04-09 NOTE — Lactation Note (Signed)
This note was copied from a baby's chart. Lactation Consultation Note  Patient Name: Adelina Mings Today's Date: 04/09/2022 Age:32 wk.o. Reason for consult:  (outpatient feeding assessment)  Mother called 04/07/2022 after pediatrician appointment to request outpatient lactation support for 54 week old baby girl.   Birth weight: 8lb15oz Discharge weight: 8lb 8 oz 2 week MD appointment: 9lb 4 week MD appointment: 9lb 5oz -Slow rate of weight gain  Mom reports that baby was feeding on demand, never seeming satisfied. After the 2 week MD appointment the baby began to sleep 6-7hr stretches at night without feeding which could have contributed to the slow rate of weight gain, and decrease in milk supply with no stimulation/milk removal overnight.  Mom also notes that baby is currently feeding 10 minutes per breast, falling asleep quickly- Switching breasts too soon before reaching the hindmilk may also be contributing to a slower rate of weight gain. Education given about fore milk and hind milk and intake of both in appropriate volumes.  Maternal Data Has patient been taught Hand Expression?: Yes Does the patient have breastfeeding experience prior to this delivery?: No  Wednesday (04/07/22) Mom began to implement pumping post BF, and provided EBM via bottle post BF as often as she could. Her supply she had stored up is running low.  Feeding Mother's Current Feeding Choice: Breast Milk  Feeding assessment: Begin Weight: 4354g (9lb 9.6oz)  BF on R side continuously or 24 minutes. LC had mom implement breast massage and compression during feeding, and recommended that baby remain on one side as long as possible.  Post weight: 4400g Transfer of 76mL  BF on L side continuously for 14 minutes with same tips/strategies implemented.  Post weight: 4471ml  Total transfer of 21mL which is approximately 2-1/3oz.  LATCH Score Latch: Grasps breast easily, tongue down, lips flanged,  rhythmical sucking.  Audible Swallowing: Spontaneous and intermittent  Type of Nipple: Everted at rest and after stimulation  Comfort (Breast/Nipple): Soft / non-tender  Hold (Positioning): No assistance needed to correctly position infant at breast.  LATCH Score: 10   Lactation Tools Discussed/Used   LC observed mom pump post BF. Mom used spectra breast pump with size 12mm Flanges. She notes some pinching sensation and discomfort regardless of suction level. Recommended mom to size up to the next flange size at next pump session and use of coconut oil or nipple balm to serve as lubricant. We reviewed the various cycles, settings, suction levels, and timing of the Spectra pump. Mom expressed approximately 66mL in 13 minutes that was provided to baby via bottle.  Interventions Interventions: Breast feeding basics reviewed;Breast massage;Breast compression;Adjust position;Support pillows;DEBP;Education  Feeding plan: -BF on demand with the implementation of breast massage/compression, allow baby to feed from one side as long as possible, with burping/re-latching as needed to waken baby back up- all before offering the second breast. -Post BF pump for 10-15 minutes, with hands on pumping and breast massage/compression for optimal milk removal/output- uses these volumes as supplement. -Supplement 1-2oz 2-3x/day of EBM or formula until weight gain is on track.  Guidance given to allow 72-96 hours of plan implementation before expecting noticeable outcomes/changes in volume of milk production.  Discharge Discharge Education: Warning signs for feeding baby;Outpatient recommendation Pump: Personal (Motif and Spectra)  Consult Status Consult Status: Complete  Family has a pediatric follow-up on 04/13/2022.  Recommended weight gain of 0.5-1oz/day would be~ 9lb 12oz from last peds appointment where the weight was 9lb5oz.  Encouraged to continue with  BF support as needed; call back after peds  appointment.  Lavonia Drafts 04/09/2022, 12:29 PM

## 2022-10-31 IMAGING — CR DG CHEST 2V
2 series · 2 of 2 positions shown · non-contrast
Comparison: None.

CLINICAL DATA: Heart fluttering.  Burning sensation in chest.

EXAM:
CHEST - 2 VIEW

[chest pa]
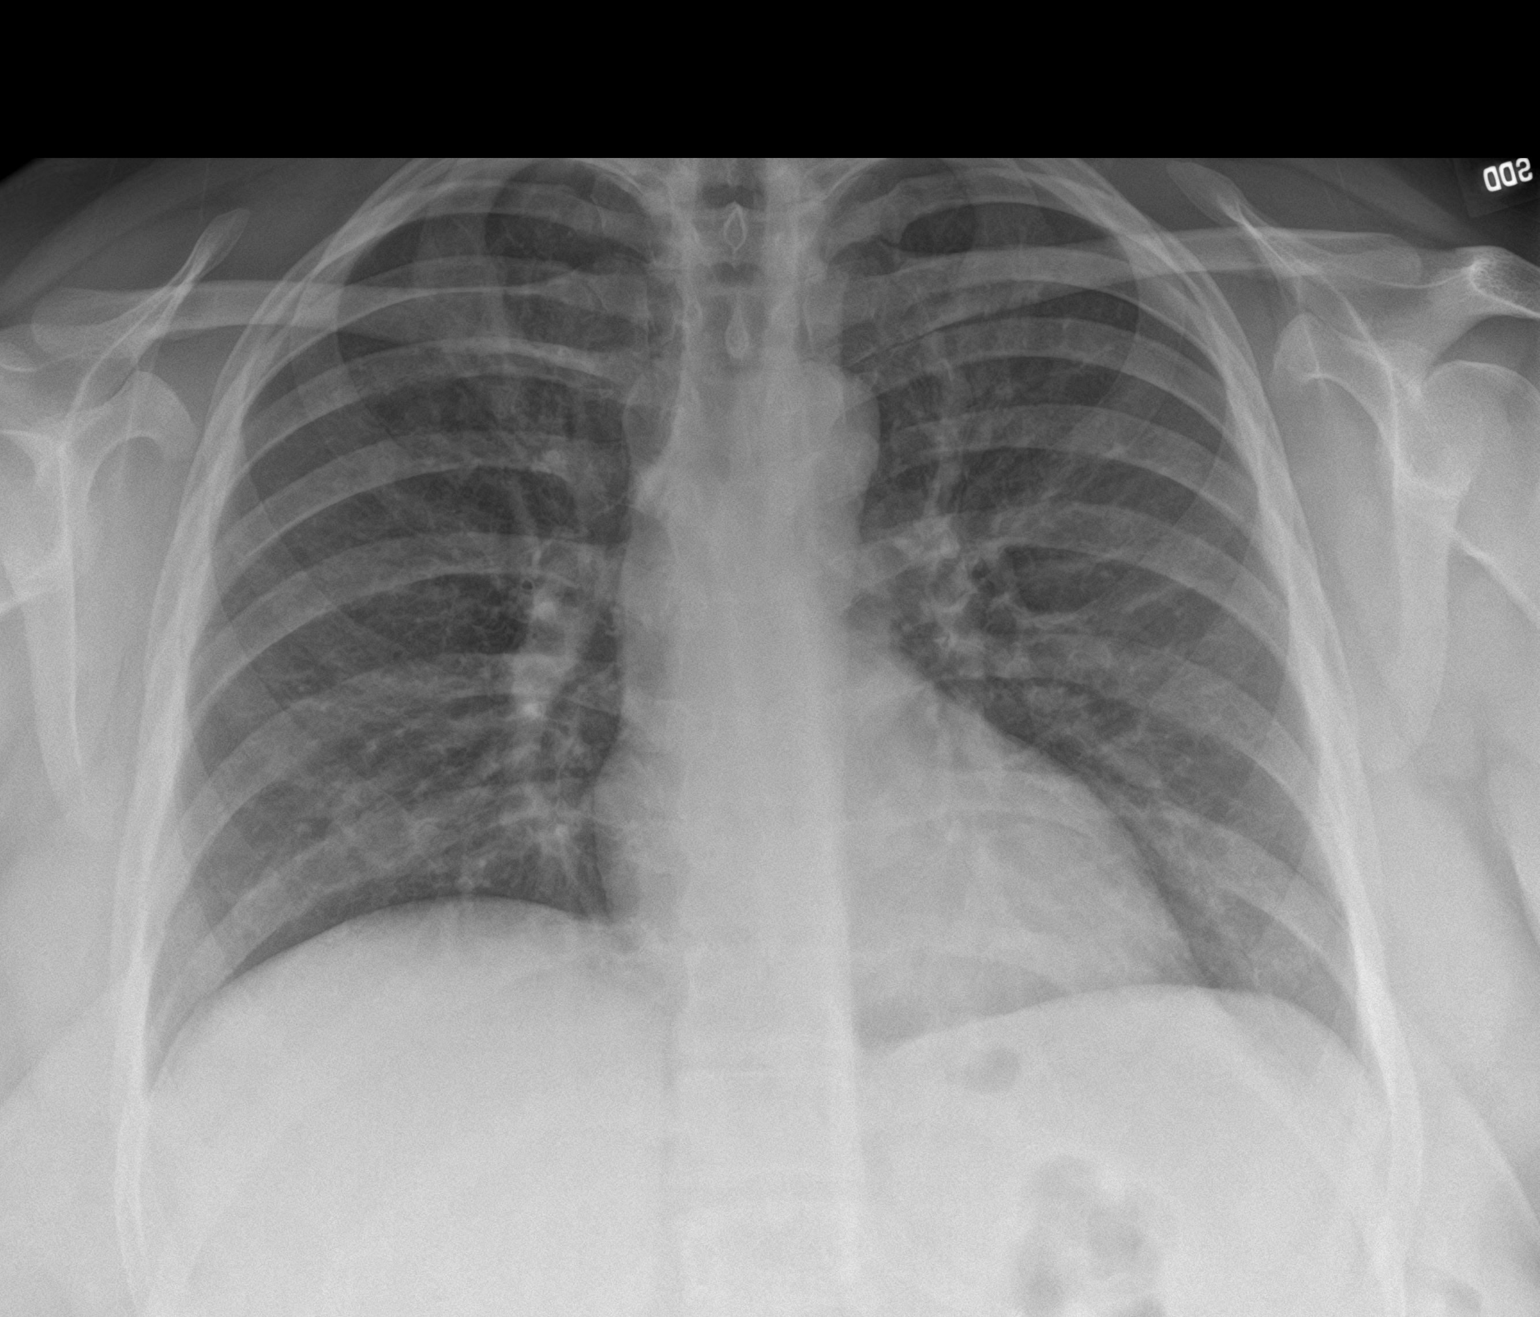

[chest lat]
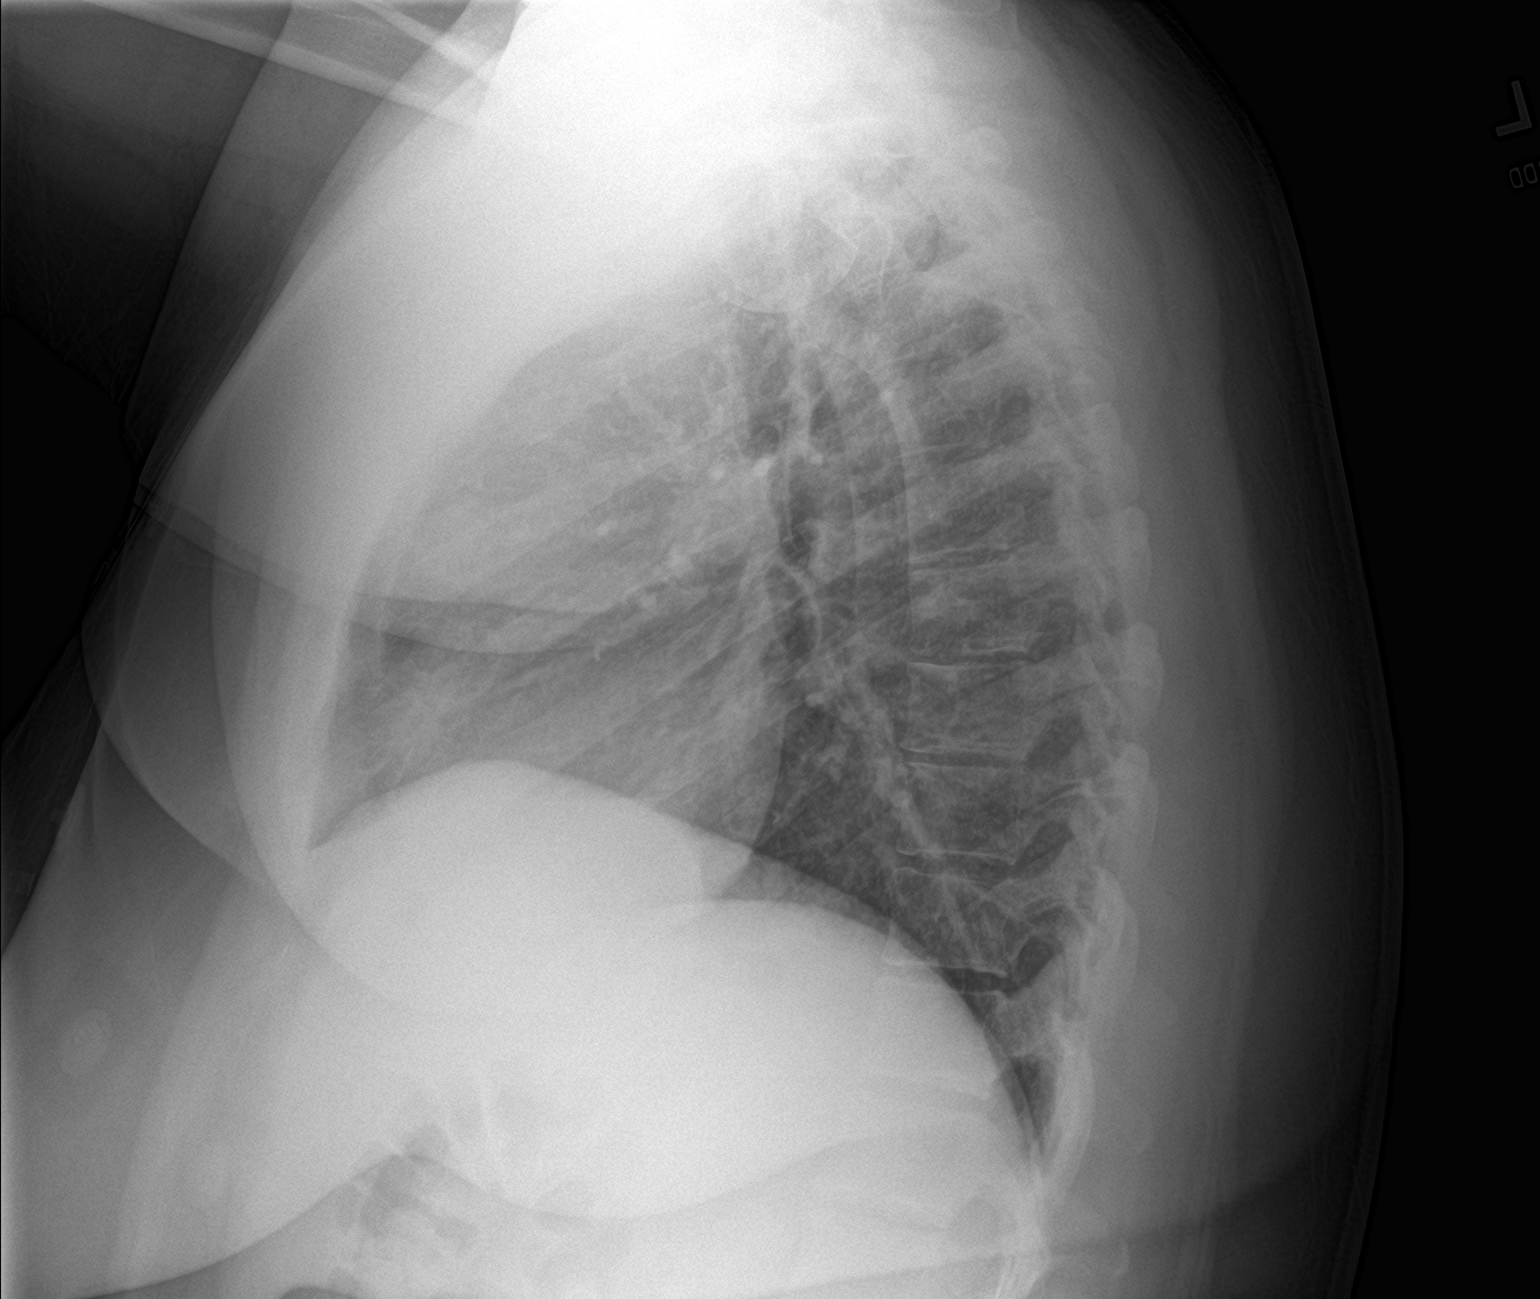

[2 of 2 positions shown; findings below may reference images not displayed]

FINDINGS: The heart size and mediastinal contours are within normal limits.
Both lungs are clear. The visualized skeletal structures are
unremarkable.
IMPRESSION: No active cardiopulmonary disease.

## 2022-12-14 ENCOUNTER — Other Ambulatory Visit: Payer: Self-pay | Admitting: Family

## 2023-05-11 ENCOUNTER — Encounter: Payer: Self-pay | Admitting: Cardiology

## 2023-05-11 ENCOUNTER — Ambulatory Visit (INDEPENDENT_AMBULATORY_CARE_PROVIDER_SITE_OTHER): Admitting: Cardiology

## 2023-05-11 ENCOUNTER — Encounter: Payer: Self-pay | Admitting: Cardiovascular Disease

## 2023-05-11 VITALS — BP 124/86 | HR 88 | Wt 298.8 lb

## 2023-05-11 DIAGNOSIS — F419 Anxiety disorder, unspecified: Secondary | ICD-10-CM | POA: Diagnosis not present

## 2023-05-11 DIAGNOSIS — R0789 Other chest pain: Secondary | ICD-10-CM | POA: Insufficient documentation

## 2023-05-11 MED ORDER — DESVENLAFAXINE SUCCINATE ER 25 MG PO TB24: 25.0000 mg | ORAL_TABLET | Freq: Every day | ORAL | 3 refills | Status: DC

## 2023-05-11 NOTE — Progress Notes (Signed)
 Established Patient Office Visit  Subjective:  Patient ID: Terri Bradshaw, female    DOB: 10-03-1990  Age: 33 y.o. MRN: [id number]  Chief Complaint  Patient presents with   Chest Pain    Patient in office for an acute visit, complaining of chest tightness. Patient is currently in the first trimester of pregnancy. Patient reports increase in anxiety due to medical history. States she has had a cough, now she is having chest tightness. EKG today showed NSR, HR 96 bpm, no acute changes. Patient reports history of acid reflux with previous pregnancy. Recommend trying Tums.  Patient is taking Childrens Zyrtec and nasal spray for allergies. Recommend taking Mucinex if approved by her midwife.  Patient also reports increase in her anxiety with her pregnancy. States she was on 75 mg of Pristiq  during previous pregnancy. Decrease to 50 mg after pregnancy. Patient requesting to increase back to 75 mg. Will send in 25 mg to supplement the 50 mg tablet.     No other concerns at this time.   Past Medical History:  Diagnosis Date   Anginal pain [facility])     Past Surgical History:  Procedure Laterality Date   CESAREAN SECTION  03/09/2022   Procedure: CESAREAN SECTION;  Surgeon: Kris Pester, MD;  Location: [facility] ORS;  Service: Obstetrics;;    Social History   Socioeconomic History   Marital status: Married    Spouse name: Not on file   Number of children: Not on file   Years of education: Not on file   Highest education level: Not on file  Occupational History   Not on file  Tobacco Use   Smoking status: Never   Smokeless tobacco: Never  Vaping Use   Vaping status: Never Used  Substance and Sexual Activity   Alcohol use: Not on file   Drug use: Not on file   Sexual activity: Not on file  Other Topics Concern   Not on file  Social History Narrative   Not on file   Social Drivers of Health   Financial Resource Strain: Not on file  Food Insecurity: No Food Insecurity  (03/08/2022)   Hunger Vital Sign    Worried About Running Out of Food in the Last Year: Never true    Ran Out of Food in the Last Year: Never true  Transportation Needs: No Transportation Needs (03/08/2022)   PRAPARE - Administrator, Civil Service (Medical): No    Lack of Transportation (Non-Medical): No  Physical Activity: Not on file  Stress: Not on file  Social Connections: Not on file  Intimate Partner Violence: Not At Risk (03/08/2022)   Humiliation, Afraid, Rape, and Kick questionnaire    Fear of Current or Ex-Partner: No    Emotionally Abused: No    Physically Abused: No    Sexually Abused: No    History reviewed. No pertinent family history.  Allergies  Allergen Reactions   Bactrim [Sulfamethoxazole-Trimethoprim]     Outpatient Medications Prior to Visit  Medication Sig   cetirizine (ZYRTEC ALLERGY) 10 MG tablet Take 10 mg by mouth at bedtime.   desvenlafaxine  (PRISTIQ ) 50 MG 24 hr tablet TAKE 1 TABLET (50 MG TOTAL) BY MOUTH DAILY. TAKE WITH 25MG  TABLET FOR A TOTAL OF 75MG .   Prenatal Vit-Fe Fumarate-FA (PRENATAL VITAMINS) 28-0.8 MG TABS Take 1 tablet by mouth daily.   EPINEPHrine  0.3 mg/0.3 mL IJ SOAJ injection Inject 0.3 mg into the muscle as needed for anaphylaxis. (Patient not taking: Reported  on 05/11/2023)   ibuprofen  (ADVIL ) 600 MG tablet Take 1 tablet (600 mg total) by mouth every 6 (six) hours as needed for mild pain or cramping. (Patient not taking: Reported on 05/11/2023)   NIFEdipine  (ADALAT  CC) 30 MG 24 hr tablet Take 1 tablet (30 mg total) by mouth daily. (Patient not taking: Reported on 05/11/2023)   promethazine -dextromethorphan (PROMETHAZINE -DM) 6.25-15 MG/5ML syrup Take 5 mLs by mouth 4 (four) times daily as needed for cough. (Patient not taking: Reported on 03/08/2022)   [DISCONTINUED] desvenlafaxine  (PRISTIQ ) 25 MG 24 hr tablet Take 1 tablet (25 mg total) by mouth daily. Take with 50 mg tablet for a total of 75mg . (Patient not taking: Reported on  05/11/2023)   [DISCONTINUED] pantoprazole  (PROTONIX ) 40 MG tablet Take 1 tablet (40 mg total) by mouth daily.   No facility-administered medications prior to visit.    Review of Systems  Constitutional: Negative.   HENT: Negative.    Eyes: Negative.   Respiratory: Negative.  Negative for shortness of breath.   Cardiovascular:  Positive for chest pain.  Gastrointestinal: Negative.  Negative for abdominal pain, constipation and diarrhea.  Genitourinary: Negative.   Musculoskeletal:  Negative for joint pain and myalgias.  Skin: Negative.   Neurological: Negative.  Negative for dizziness and headaches.  Endo/Heme/Allergies: Negative.   All other systems reviewed and are negative.      Objective:   BP 124/86   Pulse 88   Wt 298 lb 12.8 oz (135.5 kg)   SpO2 98%   BMI 48.23 kg/m   Vitals:   05/11/23 0952  BP: 124/86  Pulse: 88  Weight: 298 lb 12.8 oz (135.5 kg)  SpO2: 98%    Physical Exam Vitals and nursing note reviewed.  Constitutional:      Appearance: Normal appearance. She is normal weight.  HENT:     Head: Normocephalic and atraumatic.     Nose: Nose normal.     Mouth/Throat:     Mouth: Mucous membranes are moist.  Eyes:     Extraocular Movements: Extraocular movements intact.     Conjunctiva/sclera: Conjunctivae normal.     Pupils: Pupils are equal, round, and reactive to light.  Cardiovascular:     Rate and Rhythm: Normal rate and regular rhythm.     Pulses: Normal pulses.     Heart sounds: Normal heart sounds.  Pulmonary:     Effort: Pulmonary effort is normal.     Breath sounds: Normal breath sounds.  Abdominal:     General: Abdomen is flat. Bowel sounds are normal.     Palpations: Abdomen is soft.  Musculoskeletal:        General: Normal range of motion.     Cervical back: Normal range of motion.  Skin:    General: Skin is warm and dry.  Neurological:     General: No focal deficit present.     Mental Status: She is alert and oriented to person,  place, and time.  Psychiatric:        Mood and Affect: Mood normal.        Behavior: Behavior normal.        Thought Content: Thought content normal.        Judgment: Judgment normal.      No results found for any visits on 05/11/23.  No results found for this or any previous visit (from the past 2160 hours).    Assessment & Plan:  Tums for acid reflux Mucinex if approved by midwife Increase Pristiq   to 75 mg daily  Problem List Items Addressed This Visit       Other   Chest pain, non-cardiac - Primary   Anxiety   Relevant Medications   desvenlafaxine  (PRISTIQ ) 25 MG 24 hr tablet    Return in about 4 weeks (around 06/08/2023) for with Mylinda Asa.   Total time spent: 25 minutes  [employer], NP  05/11/2023   This document may have been prepared by Dragon Voice Recognition software and as such may include unintentional dictation errors.

## 2023-06-08 DIAGNOSIS — F419 Anxiety disorder, unspecified: Secondary | ICD-10-CM | POA: Insufficient documentation

## 2023-06-08 DIAGNOSIS — O30049 Twin pregnancy, dichorionic/diamniotic, unspecified trimester: Secondary | ICD-10-CM | POA: Insufficient documentation

## 2023-06-08 DIAGNOSIS — O34219 Maternal care for unspecified type scar from previous cesarean delivery: Secondary | ICD-10-CM | POA: Insufficient documentation

## 2023-06-08 DIAGNOSIS — Z315 Encounter for genetic counseling: Secondary | ICD-10-CM | POA: Insufficient documentation

## 2023-06-10 ENCOUNTER — Encounter: Payer: Self-pay | Admitting: Family

## 2023-06-10 ENCOUNTER — Ambulatory Visit (INDEPENDENT_AMBULATORY_CARE_PROVIDER_SITE_OTHER): Admitting: Family

## 2023-06-10 VITALS — BP 112/78 | HR 97 | Ht 66.0 in | Wt 301.4 lb

## 2023-06-10 DIAGNOSIS — F419 Anxiety disorder, unspecified: Secondary | ICD-10-CM

## 2023-06-10 DIAGNOSIS — R0789 Other chest pain: Secondary | ICD-10-CM

## 2023-06-10 DIAGNOSIS — O09899 Supervision of other high risk pregnancies, unspecified trimester: Secondary | ICD-10-CM | POA: Insufficient documentation

## 2023-06-10 DIAGNOSIS — Z2839 Other underimmunization status: Secondary | ICD-10-CM | POA: Insufficient documentation

## 2023-07-28 ENCOUNTER — Encounter: Payer: Self-pay | Admitting: Family

## 2023-07-28 ENCOUNTER — Ambulatory Visit (INDEPENDENT_AMBULATORY_CARE_PROVIDER_SITE_OTHER): Admitting: Family

## 2023-07-28 VITALS — BP 102/74 | HR 106 | Ht 66.0 in | Wt 308.0 lb

## 2023-07-28 DIAGNOSIS — Z111 Encounter for screening for respiratory tuberculosis: Secondary | ICD-10-CM | POA: Diagnosis not present

## 2023-07-28 DIAGNOSIS — Z0001 Encounter for general adult medical examination with abnormal findings: Secondary | ICD-10-CM

## 2023-07-28 DIAGNOSIS — Z0130 Encounter for examination of blood pressure without abnormal findings: Secondary | ICD-10-CM

## 2023-07-28 MED ORDER — DESVENLAFAXINE SUCCINATE ER 25 MG PO TB24: 25.0000 mg | ORAL_TABLET | Freq: Every day | ORAL | 1 refills | Status: DC

## 2023-07-28 MED ORDER — DESVENLAFAXINE SUCCINATE ER 50 MG PO TB24: 50.0000 mg | ORAL_TABLET | Freq: Every day | ORAL | 2 refills | Status: DC

## 2023-07-28 NOTE — Progress Notes (Signed)
 Complete physical exam  Patient: Terri Bradshaw   DOB: October 20, 1990   32 y.o. Female  MRN: [id number]  Subjective:    Chief Complaint  Patient presents with   Annual Exam    CPE    Terri Bradshaw is a 33 y.o. female who presents today for a complete physical exam. She reports consuming a general diet.  She generally feels fairly well. She reports sleeping well. She does not have additional problems to discuss today.    Most recent depression screenings:    06/24/2023   12:01 PM  PHQ 2/9 Scores  PHQ - 2 Score 0  PHQ- 9 Score 2     Past Medical History:  Diagnosis Date   Anginal pain [facility])     Past Surgical History:  Procedure Laterality Date   CESAREAN SECTION  03/09/2022   Procedure: CESAREAN SECTION;  Surgeon: Terri Garnette BIRCH, MD;  Location: [facility] ORS;  Service: Obstetrics;;    No family history on file.  Social History   Socioeconomic History   Marital status: Married    Spouse name: Not on file   Number of children: Not on file   Years of education: Not on file   Highest education level: Not on file  Occupational History   Not on file  Tobacco Use   Smoking status: Never   Smokeless tobacco: Never  Vaping Use   Vaping status: Never Used  Substance and Sexual Activity   Alcohol use: Not on file   Drug use: Not on file   Sexual activity: Not on file  Other Topics Concern   Not on file  Social History Narrative   Not on file   Social Drivers of Health   Financial Resource Strain: Not on file  Food Insecurity: No Food Insecurity (03/08/2022)   Hunger Vital Sign    Worried About Running Out of Food in the Last Year: Never true    Ran Out of Food in the Last Year: Never true  Transportation Needs: No Transportation Needs (03/08/2022)   PRAPARE - Administrator, Civil Service (Medical): No    Lack of Transportation (Non-Medical): No  Physical Activity: Not on file  Stress: Not on file  Social Connections: Not on file   Intimate Partner Violence: Not At Risk (06/08/2023)   Received from [facility]   Humiliation, Afraid, Rape, and Kick questionnaire    Within the last year, have you been afraid of your partner or ex-partner?: No    Within the last year, have you been humiliated or emotionally abused in other ways by your partner or ex-partner?: No    Within the last year, have you been kicked, hit, slapped, or otherwise physically hurt by your partner or ex-partner?: No    Within the last year, have you been raped or forced to have any kind of sexual activity by your partner or ex-partner?: No    Outpatient Medications Prior to Visit  Medication Sig   cetirizine (ZYRTEC ALLERGY) 10 MG tablet Take 10 mg by mouth at bedtime.   desvenlafaxine  (PRISTIQ ) 25 MG 24 hr tablet Take 1 tablet (25 mg total) by mouth daily. Take with 50 mg tablet for a total of 75mg .   desvenlafaxine  (PRISTIQ ) 50 MG 24 hr tablet TAKE 1 TABLET (50 MG TOTAL) BY MOUTH DAILY. TAKE WITH 25MG  TABLET FOR A TOTAL OF 75MG .   Prenatal Vit-Fe Fumarate-FA (PRENATAL VITAMINS) 28-0.8 MG TABS Take 1 tablet by mouth daily.  EPINEPHrine  0.3 mg/0.3 mL IJ SOAJ injection Inject 0.3 mg into the muscle as needed for anaphylaxis. (Patient not taking: Reported on 07/28/2023)   ibuprofen  (ADVIL ) 600 MG tablet Take 1 tablet (600 mg total) by mouth every 6 (six) hours as needed for mild pain or cramping. (Patient not taking: Reported on 07/28/2023)   NIFEdipine  (ADALAT  CC) 30 MG 24 hr tablet Take 1 tablet (30 mg total) by mouth daily. (Patient not taking: Reported on 07/28/2023)   promethazine -dextromethorphan (PROMETHAZINE -DM) 6.25-15 MG/5ML syrup Take 5 mLs by mouth 4 (four) times daily as needed for cough. (Patient not taking: Reported on 07/28/2023)   No facility-administered medications prior to visit.    Review of Systems  All other systems reviewed and are negative.       Objective:     BP 102/74   Pulse (!) 106   Ht 5' 6 (1.676 m)   Wt (!)  308 lb (139.7 kg)   SpO2 98%   BMI 49.71 kg/m    Physical Exam Vitals and nursing note reviewed.  Constitutional:      Appearance: Normal appearance. She is normal weight.  HENT:     Head: Normocephalic.  Eyes:     Extraocular Movements: Extraocular movements intact.     Conjunctiva/sclera: Conjunctivae normal.     Pupils: Pupils are equal, round, and reactive to light.  Cardiovascular:     Rate and Rhythm: Normal rate.  Pulmonary:     Effort: Pulmonary effort is normal.  Neurological:     General: No focal deficit present.     Mental Status: She is alert and oriented to person, place, and time. Mental status is at baseline.  Psychiatric:        Mood and Affect: Mood normal.        Behavior: Behavior normal.        Thought Content: Thought content normal.      No results found for any visits on 07/28/23.  No results found for this or any previous visit (from the past 2160 hours).      Assessment & Plan:    Routine Health Maintenance and Physical Exam  Immunization History  Administered Date(s) Administered   Rsv, Bivalent, Protein Subunit Rsvpref,pf [place]) 01/15/2022   Tdap 01/15/2022    Health Maintenance  Topic Date Due   Hepatitis B Vaccines (1 of 3 - 19+ 3-dose series) Never done   HPV VACCINES (1 - 3-dose SCDM series) Never done   Cervical Cancer Screening (HPV/Pap Cotest)  Never done   COVID-19 Vaccine (1 - 2024-25 season) Never done   INFLUENZA VACCINE  08/12/2023   DTaP/Tdap/Td (2 - Td or Tdap) 01/16/2032   Hepatitis C Screening  Completed   HIV Screening  Completed   Meningococcal B Vaccine  Aged Out    Discussed health benefits of physical activity, and encouraged her to engage in regular exercise appropriate for her age and condition.  No follow-ups on file.     Terri CHRISTELLA ARRANT, FNP  07/28/2023   This document may have been prepared by Dragon Voice Recognition software and as such may include unintentional dictation errors.

## 2023-08-01 LAB — QUANTIFERON-TB GOLD PLUS
QuantiFERON Mitogen Value: >10 [IU]/mL
QuantiFERON Nil Value: 0 [IU]/mL
QuantiFERON TB1 Ag Value: 0 [IU]/mL
QuantiFERON TB2 Ag Value: 0.01 [IU]/mL
QuantiFERON-TB Gold Plus: NEGATIVE

## 2023-08-18 ENCOUNTER — Ambulatory Visit: Payer: Self-pay

## 2023-08-21 ENCOUNTER — Encounter: Payer: Self-pay | Admitting: Family

## 2023-08-21 NOTE — Assessment & Plan Note (Signed)
 Patient stable.  Well controlled with current therapy.   Continue current meds.

## 2023-08-21 NOTE — Progress Notes (Signed)
 Established Patient Office Visit  Subjective:  Patient ID: Terri Bradshaw, female    DOB: 03/02/1990  Age: 33 y.o. MRN: [id number]  Chief Complaint  Patient presents with   Follow-up    1 month follow up    Patient here for her 1 month follow up.  She was previously seen with non-cardiac chest pains.   These have resolved and she is doing better.   No other concerns at this time.   Past Medical History:  Diagnosis Date   Anginal pain [facility])     Past Surgical History:  Procedure Laterality Date   CESAREAN SECTION  03/09/2022   Procedure: CESAREAN SECTION;  Surgeon: Leonce Garnette BIRCH, MD;  Location: [facility] ORS;  Service: Obstetrics;;    Social History   Socioeconomic History   Marital status: Married    Spouse name: Not on file   Number of children: Not on file   Years of education: Not on file   Highest education level: Not on file  Occupational History   Not on file  Tobacco Use   Smoking status: Never   Smokeless tobacco: Never  Vaping Use   Vaping status: Never Used  Substance and Sexual Activity   Alcohol use: Not on file   Drug use: Not on file   Sexual activity: Not on file  Other Topics Concern   Not on file  Social History Narrative   Not on file   Social Drivers of Health   Financial Resource Strain: Not on file  Food Insecurity: No Food Insecurity (03/08/2022)   Hunger Vital Sign    Worried About Running Out of Food in the Last Year: Never true    Ran Out of Food in the Last Year: Never true  Transportation Needs: No Transportation Needs (03/08/2022)   PRAPARE - Administrator, Civil Service (Medical): No    Lack of Transportation (Non-Medical): No  Physical Activity: Not on file  Stress: Not on file  Social Connections: Not on file  Intimate Partner Violence: Not At Risk (06/08/2023)   Received from [facility]   Humiliation, Afraid, Rape, and Kick questionnaire    Within the last year, have you been afraid of your  partner or ex-partner?: No    Within the last year, have you been humiliated or emotionally abused in other ways by your partner or ex-partner?: No    Within the last year, have you been kicked, hit, slapped, or otherwise physically hurt by your partner or ex-partner?: No    Within the last year, have you been raped or forced to have any kind of sexual activity by your partner or ex-partner?: No    History reviewed. No pertinent family history.  Allergies  Allergen Reactions   Bactrim [Sulfamethoxazole-Trimethoprim]     Review of Systems  All other systems reviewed and are negative.      Objective:   BP 112/78   Pulse 97   Ht 5' 6 (1.676 m)   Wt (!) 301 lb 6.4 oz (136.7 kg)   SpO2 97%   BMI 48.65 kg/m   Vitals:   06/10/23 1519  BP: 112/78  Pulse: 97  Height: 5' 6 (1.676 m)  Weight: (!) 301 lb 6.4 oz (136.7 kg)  SpO2: 97%  BMI (Calculated): 48.67    Physical Exam Vitals and nursing note reviewed.  Constitutional:      Appearance: Normal appearance. She is normal weight.  HENT:     Head: Normocephalic.  Eyes:     Extraocular Movements: Extraocular movements intact.     Conjunctiva/sclera: Conjunctivae normal.     Pupils: Pupils are equal, round, and reactive to light.  Cardiovascular:     Rate and Rhythm: Normal rate.  Pulmonary:     Effort: Pulmonary effort is normal.  Musculoskeletal:        General: Normal range of motion.  Neurological:     General: No focal deficit present.     Mental Status: She is alert and oriented to person, place, and time. Mental status is at baseline.  Psychiatric:        Mood and Affect: Mood normal.        Behavior: Behavior normal.        Thought Content: Thought content normal.        Judgment: Judgment normal.      No results found for any visits on 06/10/23.  Recent Results (from the past 2160 hours)  QuantiFERON-TB Gold Plus     Status: None   Collection Time: 07/28/23 12:16 PM  Result Value Ref Range    QuantiFERON Incubation Incubation performed.    QuantiFERON Criteria Comment     Comment: QuantiFERON-TB Gold Plus is a qualitative indirect test for M tuberculosis infection (including disease) and is intended for use in conjunction with risk assessment, radiography, and other medical and diagnostic evaluations. The QuantiFERON-TB Gold Plus result is determined by subtracting the Nil value from either TB antigen (Ag) value. The Mitogen tube serves as a control for the test.    QuantiFERON TB1 Ag Value 0.00 IU/mL   QuantiFERON TB2 Ag Value 0.01 IU/mL   QuantiFERON Nil Value 0.00 IU/mL   QuantiFERON Mitogen Value >10.00 IU/mL   QuantiFERON-TB Gold Plus Negative Negative    Comment: No response to M tuberculosis antigens detected. Infection with M tuberculosis is unlikely, but high risk individuals should be considered for additional testing (ATS/IDSA/CDC Clinical Practice Guidelines, 2017). The reference range is an Antigen minus Nil result of <0.35 IU/mL. Chemiluminescence immunoassay methodology        Assessment & Plan Anxiety Chest pain, non-cardiac Patient stable.  Well controlled with current therapy.   Continue current meds.      No follow-ups on file.   Total time spent: 20 minutes  ALAN CHRISTELLA ARRANT, FNP  06/10/2023   This document may have been prepared by [facility] Voice Recognition software and as such may include unintentional dictation errors.

## 2023-09-09 ENCOUNTER — Other Ambulatory Visit: Payer: Self-pay | Admitting: Family

## 2023-09-29 ENCOUNTER — Other Ambulatory Visit: Payer: Self-pay | Admitting: Family

## 2023-09-29 MED ORDER — DESVENLAFAXINE SUCCINATE ER 25 MG PO TB24: 25.0000 mg | ORAL_TABLET | Freq: Every day | ORAL | 1 refills | Status: AC
